# Patient Record
Sex: Female | Born: 1960 | Race: White | Hispanic: No | Smoking: Former smoker
Health system: Southern US, Community
[De-identification: ages and names within clinical notes are randomized; demographics above are authoritative.]

## PROBLEM LIST (undated history)

## (undated) DIAGNOSIS — F419 Anxiety disorder, unspecified: Secondary | ICD-10-CM

## (undated) DIAGNOSIS — T7840XA Allergy, unspecified, initial encounter: Secondary | ICD-10-CM

## (undated) DIAGNOSIS — F32A Depression, unspecified: Secondary | ICD-10-CM

## (undated) HISTORY — DX: Depression, unspecified: F32.A

## (undated) HISTORY — PX: MASTOPEXY: SHX5358

## (undated) HISTORY — DX: Allergy, unspecified, initial encounter: T78.40XA

## (undated) HISTORY — PX: COSMETIC SURGERY: SHX468

## (undated) HISTORY — PX: OTHER SURGICAL HISTORY: SHX169

## (undated) HISTORY — PX: BUNIONECTOMY: SHX129

## (undated) HISTORY — PX: BREAST ENHANCEMENT SURGERY: SHX7

## (undated) HISTORY — PX: WISDOM TOOTH EXTRACTION: SHX21

## (undated) HISTORY — DX: Anxiety disorder, unspecified: F41.9

## (undated) HISTORY — PX: POLYPECTOMY: SHX149

---

## 2013-03-11 HISTORY — PX: COLONOSCOPY: SHX174

## 2013-07-06 ENCOUNTER — Encounter: Payer: Self-pay | Admitting: Internal Medicine

## 2013-07-20 ENCOUNTER — Encounter: Payer: Self-pay | Admitting: Internal Medicine

## 2013-08-24 ENCOUNTER — Encounter: Payer: Self-pay | Admitting: Internal Medicine

## 2013-09-07 ENCOUNTER — Ambulatory Visit (AMBULATORY_SURGERY_CENTER): Payer: Managed Care, Other (non HMO)

## 2013-09-07 VITALS — Ht 64.0 in | Wt 136.2 lb

## 2013-09-07 DIAGNOSIS — Z800 Family history of malignant neoplasm of digestive organs: Secondary | ICD-10-CM

## 2013-09-07 NOTE — Progress Notes (Signed)
No allergies to eggs or soy No past problems with anesthesia No home oxygen No diet/weight loss meds  Has email  Emmi instructions given for colonoscopy 

## 2013-09-09 ENCOUNTER — Encounter: Payer: Self-pay | Admitting: Internal Medicine

## 2013-09-23 ENCOUNTER — Encounter: Payer: Self-pay | Admitting: Internal Medicine

## 2013-09-23 ENCOUNTER — Ambulatory Visit (AMBULATORY_SURGERY_CENTER): Payer: Managed Care, Other (non HMO) | Admitting: Internal Medicine

## 2013-09-23 VITALS — BP 114/58 | HR 58 | Temp 98.3°F | Resp 15 | Ht 64.0 in | Wt 136.0 lb

## 2013-09-23 DIAGNOSIS — D126 Benign neoplasm of colon, unspecified: Secondary | ICD-10-CM

## 2013-09-23 DIAGNOSIS — Z1211 Encounter for screening for malignant neoplasm of colon: Secondary | ICD-10-CM

## 2013-09-23 MED ORDER — SODIUM CHLORIDE 0.9 % IV SOLN: 500.0000 mL | INTRAVENOUS | Status: DC

## 2013-09-23 NOTE — Progress Notes (Signed)
Report to PACU, RN, vss, BBS= Clear.  

## 2013-09-23 NOTE — Progress Notes (Signed)
PT. Denies having blood cultures after returning from [place], she stated that she had vomiting and diarrhea after returning from trip. Risk management nurse made aware.

## 2013-09-23 NOTE — Patient Instructions (Signed)
YOU HAD AN ENDOSCOPIC PROCEDURE TODAY AT THE [facility]: Refer to the procedure report that was given to you for any specific questions about what was found during the examination.  If the procedure report does not answer your questions, please call your gastroenterologist to clarify.  If you requested that your care partner not be given the details of your procedure findings, then the procedure report has been included in a sealed envelope for you to review at your convenience later.  YOU SHOULD EXPECT: Some feelings of bloating in the abdomen. Passage of more gas than usual.  Walking can help get rid of the air that was put into your GI tract during the procedure and reduce the bloating. If you had a lower endoscopy (such as a colonoscopy or flexible sigmoidoscopy) you may notice spotting of blood in your stool or on the toilet paper. If you underwent a bowel prep for your procedure, then you may not have a normal bowel movement for a few days.  DIET: Your first meal following the procedure should be a light meal and then it is ok to progress to your normal diet.  A half-sandwich or bowl of soup is an example of a good first meal.  Heavy or fried foods are harder to digest and may make you feel nauseous or bloated.  Likewise meals heavy in dairy and vegetables can cause extra gas to form and this can also increase the bloating.  Drink plenty of fluids but you should avoid alcoholic beverages for 24 hours.  ACTIVITY: Your care partner should take you home directly after the procedure.  You should plan to take it easy, moving slowly for the rest of the day.  You can resume normal activity the day after the procedure however you should NOT DRIVE or use heavy machinery for 24 hours (because of the sedation medicines used during the test).    SYMPTOMS TO REPORT IMMEDIATELY: A gastroenterologist can be reached at any hour.  During normal business hours, 8:30 AM to 5:00 PM Monday through Friday,  call [phone].  After hours and on weekends, please call the GI answering service at [phone] who will take a message and have the physician on call contact you.   Following lower endoscopy (colonoscopy or flexible sigmoidoscopy):  Excessive amounts of blood in the stool  Significant tenderness or worsening of abdominal pains  Swelling of the abdomen that is new, acute  Fever of 100F or higher    FOLLOW UP: If any biopsies were taken you will be contacted by phone or by letter within the next 1-3 weeks.  Call your gastroenterologist if you have not heard about the biopsies in 3 weeks.  Our staff will call the home number listed on your records the next business day following your procedure to check on you and address any questions or concerns that you may have at that time regarding the information given to you following your procedure. This is a courtesy call and so if there is no answer at the home number and we have not heard from you through the emergency physician on call, we will assume that you have returned to your regular daily activities without incident.  SIGNATURES/CONFIDENTIALITY: You and/or your care partner have signed paperwork which will be entered into your electronic medical record.  These signatures attest to the fact that that the information above on your After Visit Summary has been reviewed and is understood.  Full responsibility of the confidentiality   of this discharge information lies with you and/or your care-partner.  Polyp information given.  Avoid all NSAIDS for next two weeks.

## 2013-09-23 NOTE — Op Note (Signed)
[place]  [address] [place], [id number]   COLONOSCOPY PROCEDURE REPORT  PATIENT: Karen Munoz, Karen Munoz  MR#: [id number] BIRTHDATE: Oct 16, 1960 , 52  yrs. old GENDER: Female ENDOSCOPIST: Jerene Bears, MD REFERRED BY: Jerline Pain, MD PROCEDURE DATE:  09/23/2013 PROCEDURE:   Colonoscopy with snare polypectomy First Screening Colonoscopy - Avg.  risk and is 50 yrs.  old or older Yes.  Prior Negative Screening - Now for repeat screening. N/A  History of Adenoma - Now for follow-up colonoscopy & has been > or = to 3 yrs.  N/A  Polyps Removed Today? Yes. ASA CLASS:   Class I INDICATIONS:average risk screening and first colonoscopy. MEDICATIONS: MAC sedation, administered by CRNA and propofol (Diprivan) 500mg  IV  DESCRIPTION OF PROCEDURE:   After the risks benefits and alternatives of the procedure were thoroughly explained, informed consent was obtained.  A digital rectal exam revealed no rectal mass.   The LB [id number]  endoscope was introduced through the anus and advanced to the cecum, which was identified by both the appendix and ileocecal valve. No adverse events experienced. The quality of the prep was good, using MoviPrep  The instrument was then slowly withdrawn as the colon was fully examined.  COLON FINDINGS: A sessile polyp measuring 8-9 mm in size with a mucous cap was found at the cecum.  A polypectomy was performed with a cold snare.  The resection was complete and the polyp tissue was completely retrieved.   A semi-pedunculated polyp measuring 6 mm in size was found in the rectosigmoid colon.  A polypectomy was performed with a cold snare.  The resection was complete and the polyp tissue was completely retrieved.   The colon mucosa was otherwise normal.  Retroflexed views revealed external hemorrhoids. The time to cecum=5 minutes 00 seconds.  Withdrawal time=15 minutes 18 seconds.  The scope was withdrawn and the procedure  completed. COMPLICATIONS: There were no complications.  ENDOSCOPIC IMPRESSION: 1.   Sessile polyp measuring 8-9 mm in size was found at the cecum; polypectomy was performed with a cold snare 2.   Semi-pedunculated polyp measuring 6 mm in size was found in the rectosigmoid colon; polypectomy was performed with a cold snare 3.   The colon mucosa was otherwise normal  RECOMMENDATIONS: 1.  Avoid all NSAIDS for the next 2 weeks. 2.  If the polyps removed today are proven to be adenomatous (pre-cancerous) polyps, you will need a repeat colonoscopy in 5 years.  Otherwise you should continue to follow colorectal cancer screening guidelines for "routine risk" patients with colonoscopy in 10 years.  You will receive a letter within 1-2 weeks with the results of your biopsy as well as final recommendations.  Please call my office if you have not received a letter after 3 weeks.   eSigned:  Jerene Bears, MD 09/23/2013 10:06 AM     cc: The Patient; Jerline Pain, MD [place], [place])

## 2013-09-23 NOTE — Progress Notes (Signed)
Called to room to assist during endoscopic procedure.  Patient ID and intended procedure confirmed with present staff. Received instructions for my participation in the procedure from the performing physician.  

## 2013-09-24 ENCOUNTER — Telehealth: Payer: Self-pay

## 2013-09-24 NOTE — Telephone Encounter (Signed)
  Follow up Call-  Call back number 09/23/2013  Post procedure Call Back phone  # [phone]  Permission to leave phone message Yes     Patient questions:  Do you have a fever, pain , or abdominal swelling? No. Pain Score  0 *  Have you tolerated food without any problems? Yes.    Have you been able to return to your normal activities? Yes.    Do you have any questions about your discharge instructions: Diet   No. Medications  No. Follow up visit  No.  Do you have questions or concerns about your Care? No.  Actions: * If pain score is 4 or above: No action needed, pain <4.   Pt said she was still bloated this am.  I advised her she could try gas-x tabs ot [place] and see if it helps.  To call us back if gas does not subside today.  maw

## 2013-09-28 ENCOUNTER — Encounter: Payer: Self-pay | Admitting: Internal Medicine

## 2015-06-12 LAB — HM MAMMOGRAPHY: HM Mammogram: NORMAL (ref 0–4)

## 2015-06-12 LAB — HM PAP SMEAR: HM Pap smear: NORMAL

## 2016-04-22 ENCOUNTER — Encounter: Payer: Self-pay | Admitting: Primary Care

## 2016-04-22 ENCOUNTER — Ambulatory Visit (INDEPENDENT_AMBULATORY_CARE_PROVIDER_SITE_OTHER): Payer: Managed Care, Other (non HMO) | Admitting: Primary Care

## 2016-04-22 VITALS — BP 112/70 | HR 81 | Temp 97.5°F | Ht 63.5 in | Wt 140.8 lb

## 2016-04-22 DIAGNOSIS — R21 Rash and other nonspecific skin eruption: Secondary | ICD-10-CM | POA: Insufficient documentation

## 2016-04-22 DIAGNOSIS — F411 Generalized anxiety disorder: Secondary | ICD-10-CM | POA: Diagnosis not present

## 2016-04-22 DIAGNOSIS — G43901 Migraine, unspecified, not intractable, with status migrainosus: Secondary | ICD-10-CM

## 2016-04-22 HISTORY — DX: Rash and other nonspecific skin eruption: R21

## 2016-04-22 MED ORDER — PREDNISONE 10 MG PO TABS: ORAL_TABLET | ORAL | 0 refills | Status: DC

## 2016-04-22 MED ORDER — TRIAMCINOLONE ACETONIDE 0.5 % EX OINT: 1.0000 "application " | TOPICAL_OINTMENT | Freq: Two times a day (BID) | CUTANEOUS | 0 refills | Status: DC

## 2016-04-22 MED ORDER — FLUOXETINE HCL 20 MG PO TABS: 20.0000 mg | ORAL_TABLET | Freq: Every day | ORAL | 1 refills | Status: DC

## 2016-04-22 MED ORDER — CYCLOBENZAPRINE HCL 5 MG PO TABS: ORAL_TABLET | ORAL | 1 refills | Status: DC

## 2016-04-22 NOTE — Progress Notes (Signed)
Subjective:    Patient ID: Karen Munoz, female    DOB: 01-09-1961, 56 y.o.   MRN: [id number]  HPI  Karen Munoz is a 56 year old female who presents today to establish care and discuss the problems mentioned below. Will obtain old records. Her last physical was in Fall 2016.   1) Rash: Present consistently since November 2017. Located to her neck, left axilla, right axilla, under her breasts, groin, bilateral antecubital fossa. She's been applying lotrimin, coconut oil, tea tree oil, and OTC hydrocortisone. Her rash is itchy in nature. No one else in her household is itching. She denies changes in meds, detergents, soaps, deodorants. She has noticed improvement to OTC lotrimin lotion, but no resolve.   2) Frequent Headaches/Migraines: Diagnosed years ago. Currently managed on ibuprofen, cyclobenzaprine, frovatriptan. She takes Flexeril on Friday and Saturday as that is typically when her migraines present. Overall her migraines are well controlled. She has noticed feeling drowsy the morning after taking Flexeril and would like to try a reduced dose.  3) Panic Attacks/Anxiety: Sporadic since early 2017. She's experienced symptoms of irritability, worry, stress, anxiety. She is currently caring for her brother, and is tending to her mother's estate since she passes away in 08/13/15. She is also caring for her husband who has dementia and requires frequent attention. She was managed on Valium which she took PRN, she's been out of her medication for the past several months. She was once managed on Fluoxetine with success. GAD 7 score of 12 today. She takes Melatonin at bedtime for sleep.  Review of Systems  Respiratory: Negative for shortness of breath.   Cardiovascular: Negative for chest pain.  Skin: Positive for rash.  Neurological: Positive for headaches.  Psychiatric/Behavioral: Positive for sleep disturbance. Negative for suicidal ideas. The patient is nervous/anxious.        No past medical  history on file.   Social History   Social History  . Marital status: Unknown    Spouse name: N/A  . Number of children: N/A  . Years of education: N/A   Occupational History  . Not on file.   Social History Main Topics  . Smoking status: Former Research scientist (life sciences)  . Smokeless tobacco: Never Used  . Alcohol use Yes     Comment: occasionally on  monthly basis  . Drug use: No  . Sexual activity: Not on file   Other Topics Concern  . Not on file   Social History Narrative  . No narrative on file    Past Surgical History:  Procedure Laterality Date  . BUNIONECTOMY     x2  . WISDOM TOOTH EXTRACTION      Family History  Problem Relation Age of Onset  . Colon cancer Maternal Grandfather   . Renal Disease Mother   . COPD Mother   . Hypertension Mother     Allergies  Allergen Reactions  . Penicillins Rash    Current Outpatient Prescriptions on File Prior to Visit  Medication Sig Dispense Refill  . Coenzyme Q10 (CO Q 10 PO) Take 30 mg by mouth daily. 15     . niacin 250 MG tablet Take 250 mg by mouth at bedtime.    . Omega-3 Fatty Acids (FISH OIL PO) Take 690 mg by mouth.     . Red Yeast Rice Extract (RED YEAST RICE PO) Take 1,200 mg by mouth daily. 1.2gms      No current facility-administered medications on file prior to visit.  BP 112/70   Pulse 81   Temp 97.5 F (36.4 C) (Oral)   Ht 5' 3.5" (1.613 m)   Wt 140 lb 12.8 oz (63.9 kg)   SpO2 99%   BMI 24.55 kg/m    Objective:   Physical Exam  Constitutional: She appears well-nourished.  Neck: Neck supple.  Cardiovascular: Normal rate and regular rhythm.   Pulmonary/Chest: Effort normal and breath sounds normal.  Skin: Skin is warm and dry.  Dry, mildly scaling patches to neck, bilateral antecubital fossa, axilla, and under breasts. Representative of eczema.  Psychiatric: She has a normal mood and affect.          Assessment & Plan:

## 2016-04-22 NOTE — Patient Instructions (Signed)
Start fluoxetine (Prozac) 20 mg tablets for anxiety. Take 1/2 tablet daily for 8 days then increase to 1 full tablet thereafter.  Start prednisone tablets. Take three tablets for 2 days, then two tablets for 2 days, then one tablet for 3 days. This should resolve the rash.  You may apply the Triamcinolone ointment twice daily as needed for itchy/uncomfortable rash.  I sent cyclobenzaprine 5 mg tablets to your pharmacy.   Schedule a follow up visit in 4-6 weeks for re-evaluation of anxiety.  It was a pleasure to meet you today! Please don't hesitate to call me with any questions. Welcome to [facility]!

## 2016-04-22 NOTE — Progress Notes (Signed)
Pre visit review using our clinic review tool, if applicable. No additional management support is needed unless otherwise documented below in the visit note. 

## 2016-04-22 NOTE — Assessment & Plan Note (Signed)
Representative of eczema. Treat with oral prednisone given widespread presentation. Rx for Triamcinolone ointment sent to pharmacy. She will update if no improvement.

## 2016-04-22 NOTE — Assessment & Plan Note (Signed)
Doing well on flexeril, uses abortive medication infrequently. Will refill Flexeril at 5 mg to reduce next day drowsiness. Will continue to monitor.

## 2016-04-22 NOTE — Assessment & Plan Note (Signed)
Going through a lot of personal stress. GAD 7 score of 12 today. Will restart Fluoxetine as she was successful on this in the past.  Patient is to take 1/2 tablet daily for 8 days, then advance to 1 full tablet thereafter. We discussed possible side effects of headache, GI upset, drowsiness, and SI/HI. If thoughts of SI/HI develop, we discussed to present to the emergency immediately. Patient verbalized understanding.   Follow up in 6 weeks for re-evaluation.

## 2016-05-22 ENCOUNTER — Other Ambulatory Visit: Payer: Self-pay | Admitting: Primary Care

## 2016-05-22 DIAGNOSIS — Z0000 Encounter for general adult medical examination without abnormal findings: Secondary | ICD-10-CM

## 2016-05-29 ENCOUNTER — Other Ambulatory Visit (INDEPENDENT_AMBULATORY_CARE_PROVIDER_SITE_OTHER): Payer: Managed Care, Other (non HMO)

## 2016-05-29 DIAGNOSIS — Z0000 Encounter for general adult medical examination without abnormal findings: Secondary | ICD-10-CM

## 2016-05-29 LAB — VITAMIN D 25 HYDROXY (VIT D DEFICIENCY, FRACTURES): VITD: 55.11 ng/mL (ref 30.00–100.00)

## 2016-05-29 LAB — COMPREHENSIVE METABOLIC PANEL
ALBUMIN: 4.2 g/dL (ref 3.5–5.2)
ALT: 15 U/L (ref 0–35)
AST: 19 U/L (ref 0–37)
Alkaline Phosphatase: 69 U/L (ref 39–117)
BILIRUBIN TOTAL: 0.4 mg/dL (ref 0.2–1.2)
BUN: 10 mg/dL (ref 6–23)
CHLORIDE: 104 meq/L (ref 96–112)
CO2: 30 mEq/L (ref 19–32)
CREATININE: 0.73 mg/dL (ref 0.40–1.20)
Calcium: 9.6 mg/dL (ref 8.4–10.5)
GFR: 88.43 mL/min (ref >60.00)
Glucose, Bld: 95 mg/dL (ref 70–99)
Potassium: 4.2 mEq/L (ref 3.5–5.1)
Sodium: 139 mEq/L (ref 135–145)
Total Protein: 6.7 g/dL (ref 6.0–8.3)

## 2016-05-29 LAB — LIPID PANEL
CHOL/HDL RATIO: 2
Cholesterol: 226 mg/dL — ABNORMAL HIGH (ref 0–200)
HDL: 93.9 mg/dL (ref >39.00)
LDL CALC: 123 mg/dL — AB (ref 0–99)
NONHDL: 132
TRIGLYCERIDES: 47 mg/dL (ref 0.0–149.0)
VLDL: 9.4 mg/dL (ref 0.0–40.0)

## 2016-06-04 ENCOUNTER — Ambulatory Visit (INDEPENDENT_AMBULATORY_CARE_PROVIDER_SITE_OTHER): Payer: Managed Care, Other (non HMO) | Admitting: Primary Care

## 2016-06-04 ENCOUNTER — Encounter: Payer: Self-pay | Admitting: Primary Care

## 2016-06-04 VITALS — BP 116/80 | HR 71 | Temp 97.8°F | Ht 63.5 in | Wt 144.8 lb

## 2016-06-04 DIAGNOSIS — F411 Generalized anxiety disorder: Secondary | ICD-10-CM

## 2016-06-04 DIAGNOSIS — Z0001 Encounter for general adult medical examination with abnormal findings: Secondary | ICD-10-CM | POA: Insufficient documentation

## 2016-06-04 DIAGNOSIS — E785 Hyperlipidemia, unspecified: Secondary | ICD-10-CM | POA: Insufficient documentation

## 2016-06-04 DIAGNOSIS — Z23 Encounter for immunization: Secondary | ICD-10-CM | POA: Diagnosis not present

## 2016-06-04 DIAGNOSIS — Z0000 Encounter for general adult medical examination without abnormal findings: Secondary | ICD-10-CM | POA: Diagnosis not present

## 2016-06-04 DIAGNOSIS — R21 Rash and other nonspecific skin eruption: Secondary | ICD-10-CM

## 2016-06-04 DIAGNOSIS — G43701 Chronic migraine without aura, not intractable, with status migrainosus: Secondary | ICD-10-CM

## 2016-06-04 MED ORDER — FROVATRIPTAN SUCCINATE 2.5 MG PO TABS: ORAL_TABLET | ORAL | 2 refills | Status: DC

## 2016-06-04 NOTE — Progress Notes (Signed)
Pre visit review using our clinic review tool, if applicable. No additional management support is needed unless otherwise documented below in the visit note. 

## 2016-06-04 NOTE — Patient Instructions (Signed)
Continue to work on a healthy diet.   Start exercising. You should be getting 150 minutes of moderate intensity exercise weekly.  Ensure you are consuming 64 ounces of water daily.  You were provided with a tetanus vaccination today, this will cover you for 10 years.   Follow up with GYN as scheduled in April.   Follow up in 1 year for your annual exam or sooner if needed.  It was a pleasure to see you today!

## 2016-06-04 NOTE — Assessment & Plan Note (Signed)
Td due, provided today. Mammogram and Pap UTD. Colonoscopy UTD.  Discussed the importance of a healthy diet and regular exercise in order for weight loss, and to reduce the risk of other medical diseases. Exam unremarkable.  Labs with mild hyperlipidemia.  Follow up in 1 year for annual exam.

## 2016-06-04 NOTE — Assessment & Plan Note (Signed)
Significant improvement since last visit. Mild dry patches. Will have her moisturize skin.

## 2016-06-04 NOTE — Assessment & Plan Note (Signed)
Stable on current regimen, refill provided of Frova.

## 2016-06-04 NOTE — Assessment & Plan Note (Signed)
Previously on medication. TC slightly above goal, however HDL is 93. LDL unremarkable. Will continue to monitor levels, add in exercise. Continue to improve diet.

## 2016-06-04 NOTE — Addendum Note (Signed)
Addended by: Jacqualin Combes on: 06/04/2016 12:53 PM   Modules accepted: Orders

## 2016-06-04 NOTE — Progress Notes (Signed)
Subjective:    Patient ID: Karen Munoz, female    DOB: 1960/06/26, 56 y.o.   MRN: [id number]  HPI  Karen Munoz is a 56 year old female who presents today for complete physical.  Immunizations: -Tetanus: Completed in 2007, due. -Influenza: Did not complete last season.    Diet: She endorses a healthy diet. Breakfast: Oatmeal Lunch: Salad with protein Dinner: Chicken, vegetables, little starches Snacks: Chips Desserts: Occasionally Beverages: Coffee, water, ginger-ale  Exercise: She does not currently exercise Eye exam: Completed in December 2017 Dental exam: Completes every six months Colonoscopy: Completed in 2015, due in 2020 Pap Smear: Completed in 2017, follows with GYN. Mammogram: Completed in April 2017, normal.   Review of Systems  Constitutional: Negative for unexpected weight change.  HENT: Negative for rhinorrhea.   Respiratory: Negative for cough and shortness of breath.   Cardiovascular: Negative for chest pain.  Gastrointestinal: Negative for constipation and diarrhea.  Genitourinary: Negative for difficulty urinating and menstrual problem.  Musculoskeletal: Negative for arthralgias and myalgias.  Skin: Negative for rash.  Allergic/Immunologic: Negative for environmental allergies.  Neurological: Negative for dizziness, numbness and headaches.  Psychiatric/Behavioral:       Doing well with anxiety. Feels well managed on Fluoxetine.        No past medical history on file.   Social History   Social History  . Marital status: Unknown    Spouse name: N/A  . Number of children: N/A  . Years of education: N/A   Occupational History  . Not on file.   Social History Main Topics  . Smoking status: Former Research scientist (life sciences)  . Smokeless tobacco: Never Used  . Alcohol use Yes     Comment: occasionally on  monthly basis  . Drug use: No  . Sexual activity: Not on file   Other Topics Concern  . Not on file   Social History Narrative  . No narrative on file     Past Surgical History:  Procedure Laterality Date  . BUNIONECTOMY     x2  . WISDOM TOOTH EXTRACTION      Family History  Problem Relation Age of Onset  . Colon cancer Maternal Grandfather   . Renal Disease Mother   . COPD Mother   . Hypertension Mother     Allergies  Allergen Reactions  . Penicillins Rash    Current Outpatient Prescriptions on File Prior to Visit  Medication Sig Dispense Refill  . aspirin EC 81 MG tablet Take 81 mg by mouth daily.    . Cholecalciferol (D3 ADULT PO) Take 125 mcg by mouth daily.    . Coenzyme Q10 (CO Q 10 PO) Take 30 mg by mouth daily. 15     . Cyanocobalamin (B-12 PO) Take 1,000 mcg by mouth daily.    . cyclobenzaprine (FLEXERIL) 5 MG tablet Take 1 tablet by mouth at bedtime as needed for migraines/headaches. 30 tablet 1  . FLUoxetine (PROZAC) 20 MG tablet Take 1 tablet (20 mg total) by mouth daily. 30 tablet 1  . ibuprofen (ADVIL,MOTRIN) 800 MG tablet Take 800 mg by mouth.    . niacin 250 MG tablet Take 250 mg by mouth at bedtime.    . Omega-3 Fatty Acids (FISH OIL PO) Take 690 mg by mouth.     . PSYLLIUM PO Take 1,500 mg by mouth daily.    . Red Yeast Rice Extract (RED YEAST RICE PO) Take 1,200 mg by mouth daily. 1.2gms     . triamcinolone ointment (KENALOG)  0.5 % Apply 1 application topically 2 (two) times daily. 30 g 0  . frovatriptan (FROVA) 2.5 MG tablet TAKE ONE TAB BY MOUTH FOR MIGRAINE AND REPEAT IN 2 HOURS IF NEEDED. NO MORE THAN THREE TABS PER DAY     No current facility-administered medications on file prior to visit.     BP 116/80   Pulse 71   Temp 97.8 F (36.6 C) (Oral)   Ht 5' 3.5" (1.613 m)   Wt 144 lb 12.8 oz (65.7 kg)   BMI 25.25 kg/m    Objective:   Physical Exam  Constitutional: She is oriented to person, place, and time. She appears well-nourished.  HENT:  Right Ear: Tympanic membrane and ear canal normal.  Left Ear: Tympanic membrane and ear canal normal.  Nose: Nose normal.  Mouth/Throat: Oropharynx  is clear and moist.  Eyes: Conjunctivae and EOM are normal. Pupils are equal, round, and reactive to light.  Neck: Neck supple. No thyromegaly present.  Cardiovascular: Normal rate and regular rhythm.   No murmur heard. Pulmonary/Chest: Effort normal and breath sounds normal. She has no rales.  Abdominal: Soft. Bowel sounds are normal. There is no tenderness.  Musculoskeletal: Normal range of motion.  Lymphadenopathy:    She has no cervical adenopathy.  Neurological: She is alert and oriented to person, place, and time. She has normal reflexes. No cranial nerve deficit.  Skin: Skin is warm and dry. No rash noted.  Psychiatric: She has a normal mood and affect.          Assessment & Plan:

## 2016-06-04 NOTE — Assessment & Plan Note (Signed)
Significantly improved on Fluoxetine, no recent panic attacks. Continue current dose.

## 2016-06-12 ENCOUNTER — Other Ambulatory Visit: Payer: Self-pay | Admitting: Primary Care

## 2016-06-12 DIAGNOSIS — F411 Generalized anxiety disorder: Secondary | ICD-10-CM

## 2016-06-14 ENCOUNTER — Other Ambulatory Visit: Payer: Self-pay | Admitting: Primary Care

## 2016-06-14 DIAGNOSIS — F411 Generalized anxiety disorder: Secondary | ICD-10-CM

## 2016-06-17 MED ORDER — FLUOXETINE HCL 20 MG PO CAPS: 20.0000 mg | ORAL_CAPSULE | Freq: Every day | ORAL | 1 refills | Status: DC

## 2016-06-17 NOTE — Addendum Note (Signed)
Addended by: Jacqualin Combes on: 06/17/2016 05:45 PM   Modules accepted: Orders

## 2016-06-22 ENCOUNTER — Other Ambulatory Visit: Payer: Self-pay | Admitting: Primary Care

## 2016-06-22 DIAGNOSIS — G43901 Migraine, unspecified, not intractable, with status migrainosus: Secondary | ICD-10-CM

## 2016-11-27 ENCOUNTER — Other Ambulatory Visit: Payer: Self-pay | Admitting: Primary Care

## 2016-11-27 DIAGNOSIS — G43901 Migraine, unspecified, not intractable, with status migrainosus: Secondary | ICD-10-CM

## 2016-11-27 DIAGNOSIS — G43701 Chronic migraine without aura, not intractable, with status migrainosus: Secondary | ICD-10-CM

## 2016-11-27 NOTE — Telephone Encounter (Signed)
Ok to refill? Electronically refill request for   frovatriptan (FROVA) 2.5 MG tablet - Last prescribed on 06/04/2016  cyclobenzaprine (FLEXERIL) 5 MG tablet - Last prescribed on 06/24/2016  Last seen on 06/04/2016

## 2016-11-27 NOTE — Telephone Encounter (Signed)
Refills sent to pharmacy. 

## 2016-12-21 ENCOUNTER — Other Ambulatory Visit: Payer: Self-pay | Admitting: Primary Care

## 2017-06-15 ENCOUNTER — Other Ambulatory Visit: Payer: Self-pay | Admitting: Primary Care

## 2017-06-28 ENCOUNTER — Other Ambulatory Visit: Payer: Self-pay | Admitting: Primary Care

## 2017-06-28 DIAGNOSIS — E782 Mixed hyperlipidemia: Secondary | ICD-10-CM

## 2017-07-03 ENCOUNTER — Other Ambulatory Visit (INDEPENDENT_AMBULATORY_CARE_PROVIDER_SITE_OTHER): Payer: 59

## 2017-07-03 DIAGNOSIS — E782 Mixed hyperlipidemia: Secondary | ICD-10-CM

## 2017-07-03 LAB — COMPREHENSIVE METABOLIC PANEL
ALT: 16 U/L (ref 0–35)
AST: 20 U/L (ref 0–37)
Albumin: 4.1 g/dL (ref 3.5–5.2)
Alkaline Phosphatase: 70 U/L (ref 39–117)
BUN: 13 mg/dL (ref 6–23)
CHLORIDE: 103 meq/L (ref 96–112)
CO2: 30 mEq/L (ref 19–32)
Calcium: 9.4 mg/dL (ref 8.4–10.5)
Creatinine, Ser: 0.71 mg/dL (ref 0.40–1.20)
GFR: 90.94 mL/min (ref >60.00)
Glucose, Bld: 84 mg/dL (ref 70–99)
POTASSIUM: 3.8 meq/L (ref 3.5–5.1)
SODIUM: 139 meq/L (ref 135–145)
Total Bilirubin: 0.3 mg/dL (ref 0.2–1.2)
Total Protein: 6.7 g/dL (ref 6.0–8.3)

## 2017-07-03 LAB — LIPID PANEL
Cholesterol: 210 mg/dL — ABNORMAL HIGH (ref 0–200)
HDL: 95.3 mg/dL (ref >39.00)
LDL CALC: 105 mg/dL — AB (ref 0–99)
NONHDL: 114.98
Total CHOL/HDL Ratio: 2
Triglycerides: 49 mg/dL (ref 0.0–149.0)
VLDL: 9.8 mg/dL (ref 0.0–40.0)

## 2017-07-07 ENCOUNTER — Encounter: Payer: Self-pay | Admitting: Primary Care

## 2017-07-07 ENCOUNTER — Ambulatory Visit (INDEPENDENT_AMBULATORY_CARE_PROVIDER_SITE_OTHER): Payer: 59 | Admitting: Primary Care

## 2017-07-07 VITALS — BP 122/82 | HR 71 | Temp 97.8°F | Ht 63.5 in | Wt 163.0 lb

## 2017-07-07 DIAGNOSIS — F411 Generalized anxiety disorder: Secondary | ICD-10-CM | POA: Diagnosis not present

## 2017-07-07 DIAGNOSIS — E785 Hyperlipidemia, unspecified: Secondary | ICD-10-CM

## 2017-07-07 DIAGNOSIS — R21 Rash and other nonspecific skin eruption: Secondary | ICD-10-CM | POA: Diagnosis not present

## 2017-07-07 DIAGNOSIS — Z0000 Encounter for general adult medical examination without abnormal findings: Secondary | ICD-10-CM | POA: Diagnosis not present

## 2017-07-07 DIAGNOSIS — G43901 Migraine, unspecified, not intractable, with status migrainosus: Secondary | ICD-10-CM | POA: Diagnosis not present

## 2017-07-07 NOTE — Assessment & Plan Note (Signed)
Doing well on Cyclobenzaprine 4-5 times weekly on average. Helps to prevent migraine. No recent use of Frova.

## 2017-07-07 NOTE — Assessment & Plan Note (Signed)
Doing well Prozac, continue same. Denies SI/HI.

## 2017-07-07 NOTE — Progress Notes (Signed)
Subjective:    Patient ID: Karen Munoz, female    DOB: [birth year], 57 y.o.   MRN: [id number]  HPI  Karen Munoz is a 57 year old female who presents today for complete physical.  Immunizations: -Tetanus: Completed in 2018 -Influenza: Completed last season  Diet: She endorses a fair diet. Breakfast: Oatmeal, skips, granola bar, eggs Lunch: Restaurants, salads, chicken Dinner:  Chicken, fish, salad, cereal Snacks: None Desserts: Once weekly Beverages: Coffee, water, occasional soda  Exercise: She is not currently exercising Eye exam: Completed one year ago, due soon Dental exam: Completes semi-annually  Colonoscopy: Completed in 2015, due in 2020 Pap Smear: Completed in 2017, follows with GYN. Mammogram: Completed in 2017, due this year follows with GYN.     Review of Systems  Constitutional: Negative for unexpected weight change.  HENT: Negative for rhinorrhea.   Respiratory: Negative for cough and shortness of breath.   Cardiovascular: Negative for chest pain.  Gastrointestinal: Negative for constipation and diarrhea.  Genitourinary: Negative for difficulty urinating and menstrual problem.  Musculoskeletal: Negative for arthralgias and myalgias.  Skin: Negative for rash.  Allergic/Immunologic: Negative for environmental allergies.  Neurological: Negative for dizziness, numbness and headaches.  Psychiatric/Behavioral:       Feels well managed on Prozac, continue same.       No past medical history on file.   Social History   Socioeconomic History  . Marital status: Unknown    Spouse name: Not on file  . Number of children: Not on file  . Years of education: Not on file  . Highest education level: Not on file  Occupational History  . Not on file  Social Needs  . Financial resource strain: Not on file  . Food insecurity:    Worry: Not on file    Inability: Not on file  . Transportation needs:    Medical: Not on file    Non-medical: Not on file  Tobacco  Use  . Smoking status: Former Research scientist (life sciences)  . Smokeless tobacco: Never Used  Substance and Sexual Activity  . Alcohol use: Yes    Comment: occasionally on  monthly basis  . Drug use: No  . Sexual activity: Not on file  Lifestyle  . Physical activity:    Days per week: Not on file    Minutes per session: Not on file  . Stress: Not on file  Relationships  . Social connections:    Talks on phone: Not on file    Gets together: Not on file    Attends religious service: Not on file    Active member of club or organization: Not on file    Attends meetings of clubs or organizations: Not on file    Relationship status: Not on file  . Intimate partner violence:    Fear of current or ex partner: Not on file    Emotionally abused: Not on file    Physically abused: Not on file    Forced sexual activity: Not on file  Other Topics Concern  . Not on file  Social History Narrative  . Not on file    Past Surgical History:  Procedure Laterality Date  . BUNIONECTOMY     x2  . WISDOM TOOTH EXTRACTION      Family History  Problem Relation Age of Onset  . Colon cancer Maternal Grandfather   . Renal Disease Mother   . COPD Mother   . Hypertension Mother     Allergies  Allergen Reactions  .  Penicillins Rash    Current Outpatient Medications on File Prior to Visit  Medication Sig Dispense Refill  . aspirin EC 81 MG tablet Take 81 mg by mouth daily.    . Cholecalciferol (D3 ADULT PO) Take 125 mcg by mouth daily.    . Coenzyme Q10 (CO Q 10 PO) Take 30 mg by mouth daily. 15     . Cyanocobalamin (B-12 PO) Take 1,000 mcg by mouth daily.    . cyclobenzaprine (FLEXERIL) 5 MG tablet TAKE 1 TABLET BY MOUTH AT BEDTIME AS NEEDED FOR MIGRAINES/HEADACHES. 90 tablet 2  . FLUoxetine (PROZAC) 20 MG capsule TAKE 1 CAPSULE BY MOUTH EVERY DAY 90 capsule 1  . frovatriptan (FROVA) 2.5 MG tablet TAKE 1 TABLET AT MIGRAINE ONSET. MAY REPEAT IN 2 HOURS IF NO IMPROVEMENT. DO NOT EXCEED 2 TABS/24HR 10 tablet 1  .  ibuprofen (ADVIL,MOTRIN) 800 MG tablet Take 800 mg by mouth.    . niacin 250 MG tablet Take 250 mg by mouth at bedtime.    . Omega-3 Fatty Acids (FISH OIL PO) Take 690 mg by mouth.     . PSYLLIUM PO Take 1,500 mg by mouth daily.    . Red Yeast Rice Extract (RED YEAST RICE PO) Take 1,200 mg by mouth daily. 1.2gms     . triamcinolone ointment (KENALOG) 0.5 % Apply 1 application topically 2 (two) times daily. 30 g 0   No current facility-administered medications on file prior to visit.     BP 122/82   Pulse 71   Temp 97.8 F (36.6 C) (Oral)   Ht 5' 3.5" (1.613 m)   Wt 163 lb (73.9 kg)   SpO2 97%   BMI 28.42 kg/m    Objective:   Physical Exam  Constitutional: She is oriented to person, place, and time. She appears well-nourished.  HENT:  Right Ear: Tympanic membrane and ear canal normal.  Left Ear: Tympanic membrane and ear canal normal.  Nose: Nose normal.  Mouth/Throat: Oropharynx is clear and moist.  Eyes: Pupils are equal, round, and reactive to light. Conjunctivae and EOM are normal.  Neck: Neck supple. No thyromegaly present.  Cardiovascular: Normal rate and regular rhythm.  No murmur heard. Pulmonary/Chest: Effort normal and breath sounds normal. She has no rales.  Abdominal: Soft. Bowel sounds are normal. There is no tenderness.  Musculoskeletal: Normal range of motion.  Lymphadenopathy:    She has no cervical adenopathy.  Neurological: She is alert and oriented to person, place, and time. She has normal reflexes. No cranial nerve deficit.  Skin: Skin is warm and dry. No rash noted.  Psychiatric: She has a normal mood and affect.          Assessment & Plan:

## 2017-07-07 NOTE — Patient Instructions (Addendum)
Start exercising. You should be getting 150 minutes of moderate intensity exercise weekly.  Continue to work on a healthy diet. Increase vegetables, fruit, whole grains, lean protein.  Ensure you are consuming 64 ounces of water daily.  Follow up with your gynecologist as discussed.  Follow up in 1 year for your annual exam or sooner if needed.  It was a pleasure to see you today!   Preventive Care 40-64 Years, Female Preventive care refers to lifestyle choices and visits with your health care provider that can promote health and wellness. What does preventive care include?  A yearly physical exam. This is also called an annual well check.  Dental exams once or twice a year.  Routine eye exams. Ask your health care provider how often you should have your eyes checked.  Personal lifestyle choices, including: ? Daily care of your teeth and gums. ? Regular physical activity. ? Eating a healthy diet. ? Avoiding tobacco and drug use. ? Limiting alcohol use. ? Practicing safe sex. ? Taking low-dose aspirin daily starting at age 66. ? Taking vitamin and mineral supplements as recommended by your health care provider. What happens during an annual well check? The services and screenings done by your health care provider during your annual well check will depend on your age, overall health, lifestyle risk factors, and family history of disease. Counseling Your health care provider may ask you questions about your:  Alcohol use.  Tobacco use.  Drug use.  Emotional well-being.  Home and relationship well-being.  Sexual activity.  Eating habits.  Work and work Statistician.  Method of birth control.  Menstrual cycle.  Pregnancy history.  Screening You may have the following tests or measurements:  Height, weight, and BMI.  Blood pressure.  Lipid and cholesterol levels. These may be checked every 5 years, or more frequently if you are over 49 years old.  Skin  check.  Lung cancer screening. You may have this screening every year starting at age 7 if you have a 30-pack-year history of smoking and currently smoke or have quit within the past 15 years.  Fecal occult blood test (FOBT) of the stool. You may have this test every year starting at age 39.  Flexible sigmoidoscopy or colonoscopy. You may have a sigmoidoscopy every 5 years or a colonoscopy every 10 years starting at age 60.  Hepatitis C blood test.  Hepatitis B blood test.  Sexually transmitted disease (STD) testing.  Diabetes screening. This is done by checking your blood sugar (glucose) after you have not eaten for a while (fasting). You may have this done every 1-3 years.  Mammogram. This may be done every 1-2 years. Talk to your health care provider about when you should start having regular mammograms. This may depend on whether you have a family history of breast cancer.  BRCA-related cancer screening. This may be done if you have a family history of breast, ovarian, tubal, or peritoneal cancers.  Pelvic exam and Pap test. This may be done every 3 years starting at age 77. Starting at age 59, this may be done every 5 years if you have a Pap test in combination with an HPV test.  Bone density scan. This is done to screen for osteoporosis. You may have this scan if you are at high risk for osteoporosis.  Discuss your test results, treatment options, and if necessary, the need for more tests with your health care provider. Vaccines Your health care provider may recommend certain vaccines, such  as:  Influenza vaccine. This is recommended every year.  Tetanus, diphtheria, and acellular pertussis (Tdap, Td) vaccine. You may need a Td booster every 10 years.  Varicella vaccine. You may need this if you have not been vaccinated.  Zoster vaccine. You may need this after age 58.  Measles, mumps, and rubella (MMR) vaccine. You may need at least one dose of MMR if you were born in 1957  or later. You may also need a second dose.  Pneumococcal 13-valent conjugate (PCV13) vaccine. You may need this if you have certain conditions and were not previously vaccinated.  Pneumococcal polysaccharide (PPSV23) vaccine. You may need one or two doses if you smoke cigarettes or if you have certain conditions.  Meningococcal vaccine. You may need this if you have certain conditions.  Hepatitis A vaccine. You may need this if you have certain conditions or if you travel or work in places where you may be exposed to hepatitis A.  Hepatitis B vaccine. You may need this if you have certain conditions or if you travel or work in places where you may be exposed to hepatitis B.  Haemophilus influenzae type b (Hib) vaccine. You may need this if you have certain conditions.  Talk to your health care provider about which screenings and vaccines you need and how often you need them. This information is not intended to replace advice given to you by your health care provider. Make sure you discuss any questions you have with your health care provider. Document Released: 03/24/2015 Document Revised: 11/15/2015 Document Reviewed: 12/27/2014 Elsevier Interactive Patient Education  [employer].

## 2017-07-07 NOTE — Assessment & Plan Note (Signed)
Continues to be intermittent, waxes and wanes. Will be seeing new dermatologist soon.

## 2017-07-07 NOTE — Assessment & Plan Note (Signed)
Immunizations UTD. Pap smear UTD. Mammogram due, follows with GYN. Discussed the importance of a healthy diet and regular exercise in order for weight loss, and to reduce the risk of any potential medical problems. Exam unremarkable. Labs unremarkable. Follow up in 1 year.

## 2017-07-07 NOTE — Assessment & Plan Note (Signed)
HDL of 95, LDL of 105. Discussed to start regular exercise, work on diet. Repeat in 1 year.

## 2017-09-21 ENCOUNTER — Other Ambulatory Visit: Payer: Self-pay | Admitting: Primary Care

## 2017-09-21 DIAGNOSIS — G43901 Migraine, unspecified, not intractable, with status migrainosus: Secondary | ICD-10-CM

## 2017-12-14 ENCOUNTER — Other Ambulatory Visit: Payer: Self-pay | Admitting: Primary Care

## 2017-12-23 ENCOUNTER — Other Ambulatory Visit: Payer: Self-pay | Admitting: Primary Care

## 2017-12-23 DIAGNOSIS — G43701 Chronic migraine without aura, not intractable, with status migrainosus: Secondary | ICD-10-CM

## 2017-12-23 NOTE — Telephone Encounter (Signed)
Last prescribed on 11/27/2016 Last office visit on 07/07/2017

## 2017-12-24 MED ORDER — FROVATRIPTAN SUCCINATE 2.5 MG PO TABS: ORAL_TABLET | ORAL | 0 refills | Status: DC

## 2017-12-24 NOTE — Telephone Encounter (Signed)
Noted, refill sent to pharmacy. 

## 2018-03-15 ENCOUNTER — Other Ambulatory Visit: Payer: Self-pay | Admitting: Primary Care

## 2018-03-15 DIAGNOSIS — G43901 Migraine, unspecified, not intractable, with status migrainosus: Secondary | ICD-10-CM

## 2018-04-25 ENCOUNTER — Other Ambulatory Visit: Payer: Self-pay | Admitting: Primary Care

## 2018-04-25 DIAGNOSIS — G43701 Chronic migraine without aura, not intractable, with status migrainosus: Secondary | ICD-10-CM

## 2018-04-27 NOTE — Telephone Encounter (Signed)
Please notify patient that I sent a refill for her migraine medicine to the pharmacy. She will be due for a follow-up visit/physical in April/early May 2020.  Please schedule.

## 2018-04-27 NOTE — Telephone Encounter (Signed)
Electronic refill request. Frovatriptan Last office visit:   07/07/17 Last Filled:    10 tablet 0 12/24/2017  Please advise.

## 2018-05-30 ENCOUNTER — Other Ambulatory Visit: Payer: Self-pay | Admitting: Primary Care

## 2018-07-09 ENCOUNTER — Telehealth: Payer: Self-pay | Admitting: Primary Care

## 2018-07-09 NOTE — Telephone Encounter (Signed)
Called to r/s 5/8 and 5/12 appts. Lvm asking pt to call office.

## 2018-07-17 ENCOUNTER — Other Ambulatory Visit: Payer: 59

## 2018-07-21 ENCOUNTER — Encounter: Payer: 59 | Admitting: Primary Care

## 2018-08-18 ENCOUNTER — Other Ambulatory Visit: Payer: Self-pay | Admitting: Primary Care

## 2018-08-18 DIAGNOSIS — G43701 Chronic migraine without aura, not intractable, with status migrainosus: Secondary | ICD-10-CM

## 2018-08-19 NOTE — Telephone Encounter (Signed)
Noted. CPE was cancelled due to Covid-19. We will see her in August as scheduled.

## 2018-08-19 NOTE — Telephone Encounter (Signed)
Last prescribed on 04/27/2018. Last appointment on 07/07/2017. Next future appointment on 10/13/2018

## 2018-08-25 ENCOUNTER — Encounter: Payer: Self-pay | Admitting: Internal Medicine

## 2018-09-07 ENCOUNTER — Other Ambulatory Visit: Payer: Self-pay | Admitting: Primary Care

## 2018-09-07 DIAGNOSIS — G43901 Migraine, unspecified, not intractable, with status migrainosus: Secondary | ICD-10-CM

## 2018-10-01 ENCOUNTER — Other Ambulatory Visit: Payer: Self-pay | Admitting: Primary Care

## 2018-10-01 DIAGNOSIS — E785 Hyperlipidemia, unspecified: Secondary | ICD-10-CM

## 2018-10-09 ENCOUNTER — Other Ambulatory Visit (INDEPENDENT_AMBULATORY_CARE_PROVIDER_SITE_OTHER): Payer: 59

## 2018-10-09 ENCOUNTER — Other Ambulatory Visit: Payer: Self-pay

## 2018-10-09 DIAGNOSIS — E785 Hyperlipidemia, unspecified: Secondary | ICD-10-CM

## 2018-10-09 LAB — COMPREHENSIVE METABOLIC PANEL
ALT: 11 U/L (ref 0–35)
AST: 16 U/L (ref 0–37)
Albumin: 4.4 g/dL (ref 3.5–5.2)
Alkaline Phosphatase: 70 U/L (ref 39–117)
BUN: 17 mg/dL (ref 6–23)
CO2: 30 mEq/L (ref 19–32)
Calcium: 9.6 mg/dL (ref 8.4–10.5)
Chloride: 105 mEq/L (ref 96–112)
Creatinine, Ser: 0.79 mg/dL (ref 0.40–1.20)
GFR: 75.29 mL/min (ref >60.00)
Glucose, Bld: 82 mg/dL (ref 70–99)
Potassium: 4.1 mEq/L (ref 3.5–5.1)
Sodium: 141 mEq/L (ref 135–145)
Total Bilirubin: 0.4 mg/dL (ref 0.2–1.2)
Total Protein: 7 g/dL (ref 6.0–8.3)

## 2018-10-09 LAB — LIPID PANEL
Cholesterol: 208 mg/dL — ABNORMAL HIGH (ref 0–200)
HDL: 90.2 mg/dL (ref >39.00)
LDL Cholesterol: 106 mg/dL — ABNORMAL HIGH (ref 0–99)
NonHDL: 117.64
Total CHOL/HDL Ratio: 2
Triglycerides: 56 mg/dL (ref 0.0–149.0)
VLDL: 11.2 mg/dL (ref 0.0–40.0)

## 2018-10-13 ENCOUNTER — Encounter: Payer: Self-pay | Admitting: Primary Care

## 2018-10-13 ENCOUNTER — Ambulatory Visit (INDEPENDENT_AMBULATORY_CARE_PROVIDER_SITE_OTHER): Payer: 59 | Admitting: Primary Care

## 2018-10-13 DIAGNOSIS — F411 Generalized anxiety disorder: Secondary | ICD-10-CM

## 2018-10-13 DIAGNOSIS — G43901 Migraine, unspecified, not intractable, with status migrainosus: Secondary | ICD-10-CM

## 2018-10-13 DIAGNOSIS — E785 Hyperlipidemia, unspecified: Secondary | ICD-10-CM

## 2018-10-13 NOTE — Progress Notes (Signed)
Subjective:    Patient ID: Karen Munoz, female    DOB: [birth year], 58 y.o.   MRN: [id number]  HPI  Virtual Visit via Video Note  I connected with Kathleen Tamm on 10/13/18 at  9:00 AM EDT by a video enabled telemedicine application and verified that I am speaking with the correct person using two identifiers.  Location: Patient: Home Provider: Office   I discussed the limitations of evaluation and management by telemedicine and the availability of in person appointments. The patient expressed understanding and agreed to proceed.  History of Present Illness:  [name] is a 58 year old female who presents today for follow up of chronic conditions and general health maintenance.   Immunizations: -Tetanus: Completed in 2018 -Influenza: Due this season   Diet: She endorses a fair diet. Eating vegetables, fruit, lean protein, starch. Ice cream on occasion. She is drinking coffee, water, soda on the weekends. Exercise: She is not exercising, some weights.   Eye exam: Completed in 2019 Dental exam: Due this November 2020 Colonoscopy: Completed in 2015, due. She will schedule for this Fall. Pap Smear: Following with GYN Mammogram: Following with GYN Hep C Screen: Due    Observations/Objective:  Alert and oriented. Appears well, not sickly. No distress. Speaking in complete sentences.   Assessment and Plan:  See problem based charting.  Follow Up Instructions:  Start exercising. You should be getting 150 minutes of moderate intensity exercise weekly.  Continue to work on a healthy diet.  Ensure you are drinking 64 ounces of water daily.  Complete your colonoscopy and mammogram later this year as discussed.  It was a pleasure to see you today!    I discussed the assessment and treatment plan with the patient. The patient was provided an opportunity to ask questions and all were answered. The patient agreed with the plan and demonstrated an understanding of the  instructions.   The patient was advised to call back or seek an in-person evaluation if the symptoms worsen or if the condition fails to improve as anticipated.     Pleas Koch, NP    Review of Systems  Constitutional: Negative for unexpected weight change.  HENT: Negative for rhinorrhea.   Respiratory: Negative for cough and shortness of breath.   Cardiovascular: Negative for chest pain.  Gastrointestinal: Negative for constipation and diarrhea.  Genitourinary: Negative for difficulty urinating.  Musculoskeletal: Negative for arthralgias and myalgias.  Skin: Negative for rash.  Allergic/Immunologic: Negative for environmental allergies.  Neurological: Positive for headaches. Negative for dizziness and numbness.       Intermittent headaches, migraines.       No past medical history on file.   Social History   Socioeconomic History  . Marital status: Unknown    Spouse name: Not on file  . Number of children: Not on file  . Years of education: Not on file  . Highest education level: Not on file  Occupational History  . Not on file  Social Needs  . Financial resource strain: Not on file  . Food insecurity    Worry: Not on file    Inability: Not on file  . Transportation needs    Medical: Not on file    Non-medical: Not on file  Tobacco Use  . Smoking status: Former Research scientist (life sciences)  . Smokeless tobacco: Never Used  Substance and Sexual Activity  . Alcohol use: Yes    Comment: occasionally on  monthly basis  . Drug use: No  .  Sexual activity: Not on file  Lifestyle  . Physical activity    Days per week: Not on file    Minutes per session: Not on file  . Stress: Not on file  Relationships  . Social Herbalist on phone: Not on file    Gets together: Not on file    Attends religious service: Not on file    Active member of club or organization: Not on file    Attends meetings of clubs or organizations: Not on file    Relationship status: Not on file   . Intimate partner violence    Fear of current or ex partner: Not on file    Emotionally abused: Not on file    Physically abused: Not on file    Forced sexual activity: Not on file  Other Topics Concern  . Not on file  Social History Narrative  . Not on file    Past Surgical History:  Procedure Laterality Date  . BUNIONECTOMY     x2  . WISDOM TOOTH EXTRACTION      Family History  Problem Relation Age of Onset  . Colon cancer Maternal Grandfather   . Renal Disease Mother   . COPD Mother   . Hypertension Mother     Allergies  Allergen Reactions  . Penicillins Rash    Current Outpatient Medications on File Prior to Visit  Medication Sig Dispense Refill  . aspirin EC 81 MG tablet Take 81 mg by mouth daily.    . Cholecalciferol (D3 ADULT PO) Take 125 mcg by mouth daily.    . Coenzyme Q10 (CO Q 10 PO) Take 30 mg by mouth daily. 15     . Cyanocobalamin (B-12 PO) Take 1,000 mcg by mouth daily.    . cyclobenzaprine (FLEXERIL) 5 MG tablet TAKE 1 TABLET BY MOUTH AT BEDTIME AS NEEDED FOR MIGRAINES/HEADACHES. 90 tablet 1  . FLUoxetine (PROZAC) 20 MG capsule TAKE 1 CAPSULE BY MOUTH EVERY DAY 90 capsule 1  . frovatriptan (FROVA) 2.5 MG tablet TAKE 1 TABLET AT MIGRAINE ONSET. MAY REPEAT IN 2 HOURS IF NO IMPROVEMENT. DO NOT EXCEED 2 TABS/24HR 10 tablet 0  . ibuprofen (ADVIL,MOTRIN) 800 MG tablet Take 800 mg by mouth.    . niacin 250 MG tablet Take 250 mg by mouth at bedtime.    . Omega-3 Fatty Acids (FISH OIL PO) Take 690 mg by mouth.     . PSYLLIUM PO Take 1,500 mg by mouth daily.    . Red Yeast Rice Extract (RED YEAST RICE PO) Take 1,200 mg by mouth daily. 1.2gms     . triamcinolone ointment (KENALOG) 0.5 % Apply 1 application topically 2 (two) times daily. 30 g 0   No current facility-administered medications on file prior to visit.     Wt 160 lb (72.6 kg)   BMI 27.90 kg/m    Objective:   Physical Exam  Constitutional: She is oriented to person, place, and time. She  appears well-nourished.  HENT:  Head: Normocephalic.  Respiratory: Effort normal.  Musculoskeletal: Normal range of motion.  Neurological: She is alert and oriented to person, place, and time.  Psychiatric: She has a normal mood and affect.           Assessment & Plan:

## 2018-10-13 NOTE — Assessment & Plan Note (Signed)
Lipids within normal range. Encouraged healthy diet, regular exercise.

## 2018-10-13 NOTE — Patient Instructions (Signed)
Start exercising. You should be getting 150 minutes of moderate intensity exercise weekly.  Continue to work on a healthy diet.  Ensure you are drinking 64 ounces of water daily.  Complete your colonoscopy and mammogram later this year as discussed.  It was a pleasure to see you today!

## 2018-10-13 NOTE — Assessment & Plan Note (Signed)
Occurring every 6-8 weeks on average, taking Frova and Flexeril PRN, using sparingly. Continue same.

## 2018-10-13 NOTE — Assessment & Plan Note (Signed)
Overall stable, some breakthrough symptoms.  Offered therapy for which she kindly declines at this time, she will update if symptoms become difficult to manage.   Continue fluoxetine 20 mg.

## 2018-11-10 ENCOUNTER — Other Ambulatory Visit: Payer: Self-pay | Admitting: Primary Care

## 2019-05-07 ENCOUNTER — Other Ambulatory Visit: Payer: Self-pay | Admitting: Primary Care

## 2019-05-07 DIAGNOSIS — G43901 Migraine, unspecified, not intractable, with status migrainosus: Secondary | ICD-10-CM

## 2019-05-07 NOTE — Telephone Encounter (Signed)
Electronic refill request Last office visit 10/13/2018 No upcoming appointment scheduled Cyclobenzaprine last refill 09/07/18 #90/1 Fluoxitine last refill 11/10/18 #90/1

## 2019-09-03 ENCOUNTER — Ambulatory Visit: Payer: 59 | Admitting: Primary Care

## 2019-09-03 ENCOUNTER — Other Ambulatory Visit: Payer: Self-pay

## 2019-09-03 ENCOUNTER — Encounter: Payer: Self-pay | Admitting: Primary Care

## 2019-09-03 VITALS — BP 114/70 | HR 80 | Temp 96.7°F | Wt 160.5 lb

## 2019-09-03 DIAGNOSIS — M25569 Pain in unspecified knee: Secondary | ICD-10-CM | POA: Insufficient documentation

## 2019-09-03 DIAGNOSIS — M25561 Pain in right knee: Secondary | ICD-10-CM | POA: Diagnosis not present

## 2019-09-03 DIAGNOSIS — G8929 Other chronic pain: Secondary | ICD-10-CM | POA: Insufficient documentation

## 2019-09-03 MED ORDER — DICLOFENAC SODIUM 1 % EX GEL: 2.0000 g | Freq: Three times a day (TID) | CUTANEOUS | 0 refills | Status: DC | PRN

## 2019-09-03 NOTE — Assessment & Plan Note (Signed)
Acute for the last three weeks, overall improved compared to last week. Exam today benign.   Suspect arthritis vs bursitis, there is no swelling. Rx for Voltaren gel provided.  Continue Tylenol. Discussed to purchase knee sleeve.  She will update.

## 2019-09-03 NOTE — Patient Instructions (Signed)
You may apply the diclofenac gel three times daily as needed for pain/inflammation.  Continue Tylenol or Advil if needed.  Consider purchasing a knee sleeve for support.  Update me in a week.  It was a pleasure to see you today!

## 2019-09-03 NOTE — Progress Notes (Signed)
Subjective:    Patient ID: Karen Munoz, female    DOB: 1960/07/30, 59 y.o.   MRN: [id number]  HPI  This visit occurred during the SARS-CoV-2 public health emergency.  Safety protocols were in place, including screening questions prior to the visit, additional usage of staff PPE, and extensive cleaning of exam room while observing appropriate contact time as indicated for disinfecting solutions.   Ms. Karen Munoz is a 59 year old female with a history of migraines, anxiety, hyperlipidemia who presents today with a chief complaint of knee pain.  Her pain is located to right medial knee that began about three weeks ago. She describes her pain as achy and stiff, some feeling of instability when waking and standing in the morning. Her pain is worse when walking for prolonged periods of time, also when she first walks in the morning when waking.   She denies swelling, erythema, injury/trauma. She's been taking 1000 mg of Tylenol and 200 mg of Ibuprofen with some improvement. Today she's better than she was one week ago.   Review of Systems  Musculoskeletal: Positive for arthralgias. Negative for joint swelling.  Skin: Negative for color change.  Neurological: Negative for weakness and numbness.       No past medical history on file.   Social History   Socioeconomic History   Marital status: Unknown    Spouse name: Not on file   Number of children: Not on file   Years of education: Not on file   Highest education level: Not on file  Occupational History   Not on file  Tobacco Use   Smoking status: Former Smoker   Smokeless tobacco: Never Used  Substance and Sexual Activity   Alcohol use: Yes    Comment: occasionally on  monthly basis   Drug use: No   Sexual activity: Not on file  Other Topics Concern   Not on file  Social History Narrative   Not on file   Social Determinants of Health   Financial Resource Strain:    Difficulty of Paying Living Expenses:   Food  Insecurity:    Worried About Charity fundraiser in the Last Year:    Arboriculturist in the Last Year:   Transportation Needs:    Film/video editor (Medical):    Lack of Transportation (Non-Medical):   Physical Activity:    Days of Exercise per Week:    Minutes of Exercise per Session:   Stress:    Feeling of Stress :   Social Connections:    Frequency of Communication with Friends and Family:    Frequency of Social Gatherings with Friends and Family:    Attends Religious Services:    Active Member of Clubs or Organizations:    Attends Music therapist:    Marital Status:   Intimate Partner Violence:    Fear of Current or Ex-Partner:    Emotionally Abused:    Physically Abused:    Sexually Abused:     Past Surgical History:  Procedure Laterality Date   BUNIONECTOMY     x2   WISDOM TOOTH EXTRACTION      Family History  Problem Relation Age of Onset   Colon cancer Maternal Grandfather    Renal Disease Mother    COPD Mother    Hypertension Mother     Allergies  Allergen Reactions   Penicillins Rash    Current Outpatient Medications on File Prior to Visit  Medication Sig Dispense Refill  aspirin EC 81 MG tablet Take 81 mg by mouth daily.     Cholecalciferol (D3 ADULT PO) Take 125 mcg by mouth daily.     Coenzyme Q10 (CO Q 10 PO) Take 30 mg by mouth daily. 15      Cyanocobalamin (B-12 PO) Take 1,000 mcg by mouth daily.     cyclobenzaprine (FLEXERIL) 5 MG tablet TAKE 1 TABLET BY MOUTH AT BEDTIME AS NEEDED FOR MIGRAINES/HEADACHES. 90 tablet 1   FLUoxetine (PROZAC) 20 MG capsule TAKE 1 CAPSULE BY MOUTH EVERY DAY 90 capsule 1   frovatriptan (FROVA) 2.5 MG tablet TAKE 1 TABLET AT MIGRAINE ONSET. MAY REPEAT IN 2 HOURS IF NO IMPROVEMENT. DO NOT EXCEED 2 TABS/24HR 10 tablet 0   ibuprofen (ADVIL,MOTRIN) 800 MG tablet Take 800 mg by mouth.     niacin 250 MG tablet Take 250 mg by mouth at bedtime.     Omega-3 Fatty Acids  (FISH OIL PO) Take 690 mg by mouth.      PSYLLIUM PO Take 1,500 mg by mouth daily.     Red Yeast Rice Extract (RED YEAST RICE PO) Take 1,200 mg by mouth daily. 1.2gms      triamcinolone ointment (KENALOG) 0.5 % Apply 1 application topically 2 (two) times daily. 30 g 0   No current facility-administered medications on file prior to visit.    BP 114/70 (BP Location: Left Arm, Patient Position: Sitting, Cuff Size: Normal)    Pulse 80    Temp (!) 96.7 F (35.9 C) (Temporal)    Wt 160 lb 8 oz (72.8 kg)    SpO2 100%    BMI 27.99 kg/m    Objective:   Physical Exam  Musculoskeletal:     Right knee: No swelling, deformity, erythema or bony tenderness. Normal range of motion. No tenderness. No MCL laxity, ACL laxity or PCL laxity.     Instability Tests: Anterior drawer test negative.     Left knee: No swelling, deformity, erythema or bony tenderness. Normal range of motion. No tenderness.       Legs:  Neurological: She is alert.  Skin: Skin is warm and dry. No erythema.           Assessment & Plan:

## 2019-09-14 ENCOUNTER — Encounter: Payer: Self-pay | Admitting: Internal Medicine

## 2019-10-17 ENCOUNTER — Other Ambulatory Visit: Payer: Self-pay | Admitting: Internal Medicine

## 2019-10-17 DIAGNOSIS — G43901 Migraine, unspecified, not intractable, with status migrainosus: Secondary | ICD-10-CM

## 2019-11-09 ENCOUNTER — Encounter: Payer: 59 | Admitting: Internal Medicine

## 2019-12-21 ENCOUNTER — Encounter: Payer: Self-pay | Admitting: Internal Medicine

## 2019-12-21 ENCOUNTER — Other Ambulatory Visit: Payer: Self-pay

## 2019-12-21 ENCOUNTER — Ambulatory Visit (AMBULATORY_SURGERY_CENTER): Payer: 59 | Admitting: *Deleted

## 2019-12-21 VITALS — Ht 64.0 in | Wt 150.0 lb

## 2019-12-21 DIAGNOSIS — Z86010 Personal history of colonic polyps: Secondary | ICD-10-CM

## 2019-12-21 NOTE — Progress Notes (Signed)
covid vax x3- had booster   No egg or soy allergy known to patient  No issues with past sedation with any surgeries or procedures no intubation problems in the past  No FH of Malignant Hyperthermia No diet pills per patient No home 02 use per patient  No blood thinners per patient  Pt denies issues with constipation  No A fib or A flutter  EMMI video to pt or via [place] 19 guidelines implemented in PV today with Pt and RN   Pt verified name, DOB, address and insurance during PV today. Pt mailed instruction packet to included paper to complete and mail back to [facility] with addressed and stamped envelope, Emmi video, copy of consent form to read and not return, and instructions. PV completed over the phone. Pt encouraged to call with questions or issues - sent instructions via my chart as well as mail    Due to the COVID-19 pandemic we are asking patients to follow these guidelines. Please only bring one care partner. Please be aware that your care partner may wait in the car in the parking lot or if they feel like they will be too hot to wait in the car, they may wait in the lobby on the 4th floor. All care partners are required to wear a mask the entire time (we do not have any that we can provide them), they need to practice social distancing, and we will do a Covid check for all patient's and care partners when you arrive. Also we will check their temperature and your temperature. If the care partner waits in their car they need to stay in the parking lot the entire time and we will call them on their cell phone when the patient is ready for discharge so they can bring the car to the front of the building. Also all patient's will need to wear a mask into building.

## 2020-01-04 ENCOUNTER — Encounter: Payer: 59 | Admitting: Internal Medicine

## 2020-01-04 ENCOUNTER — Other Ambulatory Visit: Payer: Self-pay

## 2020-01-04 ENCOUNTER — Encounter: Payer: Self-pay | Admitting: Internal Medicine

## 2020-01-04 ENCOUNTER — Ambulatory Visit (AMBULATORY_SURGERY_CENTER): Payer: 59 | Admitting: Internal Medicine

## 2020-01-04 VITALS — BP 115/68 | HR 77 | Temp 98.9°F | Resp 14 | Ht 64.0 in | Wt 155.0 lb

## 2020-01-04 DIAGNOSIS — Z86010 Personal history of colonic polyps: Secondary | ICD-10-CM

## 2020-01-04 DIAGNOSIS — D122 Benign neoplasm of ascending colon: Secondary | ICD-10-CM

## 2020-01-04 DIAGNOSIS — K5140 Inflammatory polyps of colon without complications: Secondary | ICD-10-CM | POA: Diagnosis not present

## 2020-01-04 MED ORDER — SODIUM CHLORIDE 0.9 % IV SOLN: 500.0000 mL | Freq: Once | INTRAVENOUS | Status: DC

## 2020-01-04 NOTE — Op Note (Signed)
[facility] Patient Name: Karen Munoz Procedure Date: 01/04/2020 8:01 AM MRN: [id number] Endoscopist: Jerene Bears , MD Age: 59 Referring MD:  Date of Birth: 05-Jun-1960 Gender: Female Account #: [id number] Procedure:                Colonoscopy Indications:              High risk colon cancer surveillance: Personal                            history of non-advanced adenoma and sessile                            serrated polyp, Last colonoscopy: July 2015 Medicines:                Monitored Anesthesia Care Procedure:                Pre-Anesthesia Assessment:                           - Prior to the procedure, a History and Physical                            was performed, and patient medications and                            allergies were reviewed. The patient's tolerance of                            previous anesthesia was also reviewed. The risks                            and benefits of the procedure and the sedation                            options and risks were discussed with the patient.                            All questions were answered, and informed consent                            was obtained. Prior Anticoagulants: The patient has                            taken no previous anticoagulant or antiplatelet                            agents. ASA Grade Assessment: II - A patient with                            mild systemic disease. After reviewing the risks                            and benefits, the patient was deemed in  satisfactory condition to undergo the procedure.                           After obtaining informed consent, the colonoscope                            was passed under direct vision. Throughout the                            procedure, the patient's blood pressure, pulse, and                            oxygen saturations were monitored continuously. The                            Colonoscope was introduced  through the anus and                            advanced to the cecum, identified by appendiceal                            orifice and ileocecal valve. The colonoscopy was                            performed without difficulty. The patient tolerated                            the procedure well. The quality of the bowel                            preparation was good. The ileocecal valve,                            appendiceal orifice, and rectum were photographed. Scope In: 8:06:33 AM Scope Out: 8:23:07 AM Scope Withdrawal Time: 0 hours 12 minutes 43 seconds  Total Procedure Duration: 0 hours 16 minutes 34 seconds  Findings:                 The digital rectal exam was normal.                           A 5 mm polyp was found in the ascending colon. The                            polyp was sessile. The polyp was removed with a                            cold snare. Resection and retrieval were complete.                           Multiple small-mouthed diverticula were found in                            the sigmoid colon.  Internal hemorrhoids were found during                            retroflexion. The hemorrhoids were small. Complications:            No immediate complications. Estimated Blood Loss:     Estimated blood loss was minimal. Impression:               - One 5 mm polyp in the ascending colon, removed                            with a cold snare. Resected and retrieved.                           - Diverticulosis in the sigmoid colon.                           - Small internal hemorrhoids. Recommendation:           - Patient has a contact number available for                            emergencies. The signs and symptoms of potential                            delayed complications were discussed with the                            patient. Return to normal activities tomorrow.                            Written discharge instructions were provided  to the                            patient.                           - Resume previous diet.                           - Continue present medications.                           - Await pathology results.                           - Repeat colonoscopy is recommended for                            surveillance. The colonoscopy date will be                            determined after pathology results from today's                            exam become available for review. Jerene Bears, MD 01/04/2020 8:26:07 AM This report has been signed electronically.

## 2020-01-04 NOTE — Progress Notes (Signed)
Pt's states no medical or surgical changes since previsit or office visit. 

## 2020-01-04 NOTE — Patient Instructions (Signed)
.  Handout on polyps, diverticulosis given.    YOU HAD AN ENDOSCOPIC PROCEDURE TODAY AT THE [facility]:   Refer to the procedure report that was given to you for any specific questions about what was found during the examination.  If the procedure report does not answer your questions, please call your gastroenterologist to clarify.  If you requested that your care partner not be given the details of your procedure findings, then the procedure report has been included in a sealed envelope for you to review at your convenience later.  YOU SHOULD EXPECT: Some feelings of bloating in the abdomen. Passage of more gas than usual.  Walking can help get rid of the air that was put into your GI tract during the procedure and reduce the bloating. If you had a lower endoscopy (such as a colonoscopy or flexible sigmoidoscopy) you may notice spotting of blood in your stool or on the toilet paper. If you underwent a bowel prep for your procedure, you may not have a normal bowel movement for a few days.  Please Note:  You might notice some irritation and congestion in your nose or some drainage.  This is from the oxygen used during your procedure.  There is no need for concern and it should clear up in a day or so.  SYMPTOMS TO REPORT IMMEDIATELY:   Following lower endoscopy (colonoscopy or flexible sigmoidoscopy):  Excessive amounts of blood in the stool  Significant tenderness or worsening of abdominal pains  Swelling of the abdomen that is new, acute  Fever of 100F or higher   For urgent or emergent issues, a gastroenterologist can be reached at any hour by calling [phone]. Do not use MyChart messaging for urgent concerns.    DIET:  We do recommend a small meal at first, but then you may proceed to your regular diet.  Drink plenty of fluids but you should avoid alcoholic beverages for 24 hours.  ACTIVITY:  You should plan to take it easy for the rest of today and you should NOT  DRIVE or use heavy machinery until tomorrow (because of the sedation medicines used during the test).    FOLLOW UP: Our staff will call the number listed on your records 48-72 hours following your procedure to check on you and address any questions or concerns that you may have regarding the information given to you following your procedure. If we do not reach you, we will leave a message.  We will attempt to reach you two times.  During this call, we will ask if you have developed any symptoms of COVID 19. If you develop any symptoms (ie: fever, flu-like symptoms, shortness of breath, cough etc.) before then, please call [phone].  If you test positive for Covid 19 in the 2 weeks post procedure, please call and report this information to us.    If any biopsies were taken you will be contacted by phone or by letter within the next 1-3 weeks.  Please call us at [phone] if you have not heard about the biopsies in 3 weeks.    SIGNATURES/CONFIDENTIALITY: You and/or your care partner have signed paperwork which will be entered into your electronic medical record.  These signatures attest to the fact that that the information above on your After Visit Summary has been reviewed and is understood.  Full responsibility of the confidentiality of this discharge information lies with you and/or your care-partner. 

## 2020-01-04 NOTE — Progress Notes (Signed)
Called to room to assist during endoscopic procedure.  Patient ID and intended procedure confirmed with present staff. Received instructions for my participation in the procedure from the performing physician.  

## 2020-01-04 NOTE — Progress Notes (Signed)
A and O x3. Report to RN. Tolerated MAC anesthesia well.

## 2020-01-06 ENCOUNTER — Other Ambulatory Visit: Payer: Self-pay | Admitting: Primary Care

## 2020-01-06 ENCOUNTER — Telehealth: Payer: Self-pay | Admitting: *Deleted

## 2020-01-06 DIAGNOSIS — E785 Hyperlipidemia, unspecified: Secondary | ICD-10-CM

## 2020-01-06 NOTE — Telephone Encounter (Signed)
  Follow up Call-  Call back number 01/04/2020  Post procedure Call Back phone  # [phone]  Permission to leave phone message Yes  Some recent data might be hidden     Patient questions:  Do you have a fever, pain , or abdominal swelling? No. Pain Score  0 *  Have you tolerated food without any problems? Yes.    Have you been able to return to your normal activities? Yes.    Do you have any questions about your discharge instructions: Diet   No. Medications  No. Follow up visit  No.  Do you have questions or concerns about your Care? No.  Actions: * If pain score is 4 or above: No action needed, pain <4.  1. Have you developed a fever since your procedure? no  2.   Have you had an respiratory symptoms (SOB or cough) since your procedure? no  3.   Have you tested positive for COVID 19 since your procedure no  4.   Have you had any family members/close contacts diagnosed with the COVID 19 since your procedure? no   If yes to any of these questions please route to Joylene John, RN and Joella Prince, RN

## 2020-01-10 ENCOUNTER — Telehealth: Payer: Self-pay

## 2020-01-10 ENCOUNTER — Other Ambulatory Visit: Payer: 59

## 2020-01-10 NOTE — Telephone Encounter (Signed)
Pt already called to [facility]sc.

## 2020-01-10 NOTE — Telephone Encounter (Signed)
[place] Night - Client Nonclinical Telephone Record AccessNurse Client [facility] Night - Client Client Site [place] Physician Alma Friendly - NP Contact Type Call Who Is Calling Patient / Member / Family / Caregiver Caller Name Cailynn Bodnar Caller Phone Number [phone] Patient Name Karen Munoz Patient DOB 03/02/1961 Call Type Message Only Information Provided Reason for Call Request to [place] Appointment Initial Comment (1/2)Caller states her and her husband have labs this morning at 820am. She is having a hard time getting him ready and doesn't think they will be able to make it. Additional Comment Caller declined triage and office hours were provided. Disp. Time Disposition Final User 01/10/2020 7:23:30 AM General Information Provided Yes Christel Mormon Call Closed By: Christel Mormon Transaction Date/Time: 01/10/2020 7:19:30 AM (ET)

## 2020-01-11 ENCOUNTER — Encounter: Payer: Self-pay | Admitting: Internal Medicine

## 2020-01-12 ENCOUNTER — Other Ambulatory Visit: Payer: Self-pay

## 2020-01-12 ENCOUNTER — Other Ambulatory Visit (INDEPENDENT_AMBULATORY_CARE_PROVIDER_SITE_OTHER): Payer: 59

## 2020-01-12 DIAGNOSIS — E785 Hyperlipidemia, unspecified: Secondary | ICD-10-CM | POA: Diagnosis not present

## 2020-01-12 LAB — CBC
HCT: 41.2 % (ref 36.0–46.0)
Hemoglobin: 13.7 g/dL (ref 12.0–15.0)
MCHC: 33.1 g/dL (ref 30.0–36.0)
MCV: 89 fl (ref 78.0–100.0)
Platelets: 252 10*3/uL (ref 150.0–400.0)
RBC: 4.63 Mil/uL (ref 3.87–5.11)
RDW: 13 % (ref 11.5–15.5)
WBC: 4.3 10*3/uL (ref 4.0–10.5)

## 2020-01-12 LAB — COMPREHENSIVE METABOLIC PANEL
ALT: 11 U/L (ref 0–35)
AST: 15 U/L (ref 0–37)
Albumin: 4.3 g/dL (ref 3.5–5.2)
Alkaline Phosphatase: 70 U/L (ref 39–117)
BUN: 10 mg/dL (ref 6–23)
CO2: 30 mEq/L (ref 19–32)
Calcium: 9.3 mg/dL (ref 8.4–10.5)
Chloride: 103 mEq/L (ref 96–112)
Creatinine, Ser: 0.77 mg/dL (ref 0.40–1.20)
GFR: 85.74 mL/min (ref >60.00)
Glucose, Bld: 92 mg/dL (ref 70–99)
Potassium: 4.2 mEq/L (ref 3.5–5.1)
Sodium: 138 mEq/L (ref 135–145)
Total Bilirubin: 0.4 mg/dL (ref 0.2–1.2)
Total Protein: 6.9 g/dL (ref 6.0–8.3)

## 2020-01-12 LAB — LIPID PANEL
Cholesterol: 273 mg/dL — ABNORMAL HIGH (ref 0–200)
HDL: 93.1 mg/dL (ref >39.00)
LDL Cholesterol: 167 mg/dL — ABNORMAL HIGH (ref 0–99)
NonHDL: 180.33
Total CHOL/HDL Ratio: 3
Triglycerides: 66 mg/dL (ref 0.0–149.0)
VLDL: 13.2 mg/dL (ref 0.0–40.0)

## 2020-01-13 ENCOUNTER — Other Ambulatory Visit: Payer: Self-pay | Admitting: Internal Medicine

## 2020-01-17 ENCOUNTER — Ambulatory Visit: Payer: 59 | Admitting: Primary Care

## 2020-01-17 ENCOUNTER — Other Ambulatory Visit: Payer: Self-pay

## 2020-01-17 ENCOUNTER — Other Ambulatory Visit: Payer: Self-pay | Admitting: Internal Medicine

## 2020-01-17 ENCOUNTER — Encounter: Payer: Self-pay | Admitting: Primary Care

## 2020-01-17 VITALS — BP 100/72 | HR 94 | Temp 97.7°F | Ht 64.02 in | Wt 157.0 lb

## 2020-01-17 DIAGNOSIS — F411 Generalized anxiety disorder: Secondary | ICD-10-CM

## 2020-01-17 DIAGNOSIS — G43901 Migraine, unspecified, not intractable, with status migrainosus: Secondary | ICD-10-CM

## 2020-01-17 DIAGNOSIS — E785 Hyperlipidemia, unspecified: Secondary | ICD-10-CM

## 2020-01-17 DIAGNOSIS — Z0000 Encounter for general adult medical examination without abnormal findings: Secondary | ICD-10-CM | POA: Diagnosis not present

## 2020-01-17 DIAGNOSIS — Z1231 Encounter for screening mammogram for malignant neoplasm of breast: Secondary | ICD-10-CM | POA: Diagnosis not present

## 2020-01-17 DIAGNOSIS — Z23 Encounter for immunization: Secondary | ICD-10-CM

## 2020-01-17 DIAGNOSIS — M25561 Pain in right knee: Secondary | ICD-10-CM

## 2020-01-17 DIAGNOSIS — G8929 Other chronic pain: Secondary | ICD-10-CM

## 2020-01-17 MED ORDER — FLUOXETINE HCL 40 MG PO CAPS: 40.0000 mg | ORAL_CAPSULE | Freq: Every day | ORAL | 3 refills | Status: DC

## 2020-01-17 NOTE — Patient Instructions (Signed)
Start exercising. You should be getting 150 minutes of moderate intensity exercise weekly.  It's important to improve your diet by reducing consumption of fast food, fried food, processed snack foods, sugary drinks. Increase consumption of fresh vegetables and fruits, whole grains, water.  Ensure you are drinking 64 ounces of water daily.  Call the [facility] to schedule your mammogram.   Schedule a lab appointment for 6 months to repeat your cholesterol level.  It was a pleasure to see you today!   Preventive Care 57-29 Years Old, Female Preventive care refers to visits with your health care provider and lifestyle choices that can promote health and wellness. This includes:  A yearly physical exam. This may also be called an annual well check.  Regular dental visits and eye exams.  Immunizations.  Screening for certain conditions.  Healthy lifestyle choices, such as eating a healthy diet, getting regular exercise, not using drugs or products that contain nicotine and tobacco, and limiting alcohol use. What can I expect for my preventive care visit? Physical exam Your health care provider will check your:  Height and weight. This may be used to calculate body mass index (BMI), which tells if you are at a healthy weight.  Heart rate and blood pressure.  Skin for abnormal spots. Counseling Your health care provider may ask you questions about your:  Alcohol, tobacco, and drug use.  Emotional well-being.  Home and relationship well-being.  Sexual activity.  Eating habits.  Work and work Statistician.  Method of birth control.  Menstrual cycle.  Pregnancy history. What immunizations do I need?  Influenza (flu) vaccine  This is recommended every year. Tetanus, diphtheria, and pertussis (Tdap) vaccine  You may need a Td booster every 10 years. Varicella (chickenpox) vaccine  You may need this if you have not been vaccinated. Zoster (shingles)  vaccine  You may need this after age 71. Measles, mumps, and rubella (MMR) vaccine  You may need at least one dose of MMR if you were born in 1957 or later. You may also need a second dose. Pneumococcal conjugate (PCV13) vaccine  You may need this if you have certain conditions and were not previously vaccinated. Pneumococcal polysaccharide (PPSV23) vaccine  You may need one or two doses if you smoke cigarettes or if you have certain conditions. Meningococcal conjugate (MenACWY) vaccine  You may need this if you have certain conditions. Hepatitis A vaccine  You may need this if you have certain conditions or if you travel or work in places where you may be exposed to hepatitis A. Hepatitis B vaccine  You may need this if you have certain conditions or if you travel or work in places where you may be exposed to hepatitis B. Haemophilus influenzae type b (Hib) vaccine  You may need this if you have certain conditions. Human papillomavirus (HPV) vaccine  If recommended by your health care provider, you may need three doses over 6 months. You may receive vaccines as individual doses or as more than one vaccine together in one shot (combination vaccines). Talk with your health care provider about the risks and benefits of combination vaccines. What tests do I need? Blood tests  Lipid and cholesterol levels. These may be checked every 5 years, or more frequently if you are over 99 years old.  Hepatitis C test.  Hepatitis B test. Screening  Lung cancer screening. You may have this screening every year starting at age 51 if you have a 30-pack-year history of smoking and  currently smoke or have quit within the past 15 years.  Colorectal cancer screening. All adults should have this screening starting at age 4 and continuing until age 55. Your health care provider may recommend screening at age 57 if you are at increased risk. You will have tests every 1-10 years, depending on your  results and the type of screening test.  Diabetes screening. This is done by checking your blood sugar (glucose) after you have not eaten for a while (fasting). You may have this done every 1-3 years.  Mammogram. This may be done every 1-2 years. Talk with your health care provider about when you should start having regular mammograms. This may depend on whether you have a family history of breast cancer.  BRCA-related cancer screening. This may be done if you have a family history of breast, ovarian, tubal, or peritoneal cancers.  Pelvic exam and Pap test. This may be done every 3 years starting at age 80. Starting at age 13, this may be done every 5 years if you have a Pap test in combination with an HPV test. Other tests  Sexually transmitted disease (STD) testing.  Bone density scan. This is done to screen for osteoporosis. You may have this scan if you are at high risk for osteoporosis. Follow these instructions at home: Eating and drinking  Eat a diet that includes fresh fruits and vegetables, whole grains, lean protein, and low-fat dairy.  Take vitamin and mineral supplements as recommended by your health care provider.  Do not drink alcohol if: ? Your health care provider tells you not to drink. ? You are pregnant, may be pregnant, or are planning to become pregnant.  If you drink alcohol: ? Limit how much you have to 0-1 drink a day. ? Be aware of how much alcohol is in your drink. In the [place] one drink equals one 12 oz bottle of beer (355 mL), one 5 oz glass of wine (148 mL), or one 1 oz glass of hard liquor (44 mL). Lifestyle  Take daily care of your teeth and gums.  Stay active. Exercise for at least 30 minutes on 5 or more days each week.  Do not use any products that contain nicotine or tobacco, such as cigarettes, e-cigarettes, and chewing tobacco. If you need help quitting, ask your health care provider.  If you are sexually active, practice safe sex. Use a  condom or other form of birth control (contraception) in order to prevent pregnancy and STIs (sexually transmitted infections).  If told by your health care provider, take low-dose aspirin daily starting at age 43. What's next?  Visit your health care provider once a year for a well check visit.  Ask your health care provider how often you should have your eyes and teeth checked.  Stay up to date on all vaccines. This information is not intended to replace advice given to you by your health care provider. Make sure you discuss any questions you have with your health care provider. Document Revised: 11/06/2017 Document Reviewed: 11/06/2017 Elsevier Patient Education  2020 [employer].

## 2020-01-17 NOTE — Progress Notes (Signed)
Subjective:    Patient ID: Karen Munoz, female    DOB: 11/01/1960, 59 y.o.   MRN: [id number]  HPI  This visit occurred during the SARS-CoV-2 public health emergency.  Safety protocols were in place, including screening questions prior to the visit, additional usage of staff PPE, and extensive cleaning of exam room while observing appropriate contact time as indicated for disinfecting solutions.   Karen Munoz is a 59 year old female who presents today for complete physical.  Overall doing well on fluoxetine 20 mg for which she's been taking for several years. She would like dose increase as she has difficulty falling asleep due to mind racing thoughts also an increase in anxiety during the day. She's tried Melatonin which is now ineffective.   The 10-year ASCVD risk score [name]., et al., 2013) is: 1.3%   Values used to calculate the score:     Age: 26 years     Sex: Female     Is Non-Hispanic African American: No     Diabetic: No     Tobacco smoker: No     Systolic Blood Pressure: 782 mmHg     Is BP treated: No     HDL Cholesterol: 93.1 mg/dL     Total Cholesterol: 273 mg/dL   Immunizations: -Tetanus: Completed in 2018 -Influenza: Due -Shingles: Never had chicken pox. -Covid-19: Completed series   Diet: She endorses a fair diet.  Exercise: No regular exercise.  Eye exam: Completed in 2021 Dental exam: Completes semi-annually   Pap Smear: Completed in 2019 Mammogram: Completed in 2020 Colonoscopy: Completed in 2021, due in 2031 Hep C Screen: Due  BP Readings from Last 3 Encounters:  01/17/20 100/72  01/04/20 115/68  09/03/19 114/70      Review of Systems  Constitutional: Negative for unexpected weight change.  HENT: Negative for rhinorrhea.   Eyes: Negative for visual disturbance.  Respiratory: Negative for cough and shortness of breath.   Cardiovascular: Negative for chest pain.  Gastrointestinal: Negative for constipation and diarrhea.  Genitourinary:  Negative for difficulty urinating.  Musculoskeletal: Negative for arthralgias.  Skin: Negative for rash.  Allergic/Immunologic: Negative for environmental allergies.  Neurological: Positive for headaches. Negative for dizziness and numbness.  Psychiatric/Behavioral: The patient is nervous/anxious.        Past Medical History:  Diagnosis Date  . Allergy   . Anxiety   . Depression      Social History   Socioeconomic History  . Marital status: Unknown    Spouse name: Not on file  . Number of children: Not on file  . Years of education: Not on file  . Highest education level: Not on file  Occupational History  . Not on file  Tobacco Use  . Smoking status: Former Research scientist (life sciences)  . Smokeless tobacco: Never Used  Substance and Sexual Activity  . Alcohol use: Yes    Comment: occasionally on  monthly basis  . Drug use: No  . Sexual activity: Not on file  Other Topics Concern  . Not on file  Social History Narrative  . Not on file   Social Determinants of Health   Financial Resource Strain:   . Difficulty of Paying Living Expenses: Not on file  Food Insecurity:   . Worried About Charity fundraiser in the Last Year: Not on file  . Ran Out of Food in the Last Year: Not on file  Transportation Needs:   . Lack of Transportation (Medical): Not on file  .  Lack of Transportation (Non-Medical): Not on file  Physical Activity:   . Days of Exercise per Week: Not on file  . Minutes of Exercise per Session: Not on file  Stress:   . Feeling of Stress : Not on file  Social Connections:   . Frequency of Communication with Friends and Family: Not on file  . Frequency of Social Gatherings with Friends and Family: Not on file  . Attends Religious Services: Not on file  . Active Member of Clubs or Organizations: Not on file  . Attends Archivist Meetings: Not on file  . Marital Status: Not on file  Intimate Partner Violence:   . Fear of Current or Ex-Partner: Not on file  .  Emotionally Abused: Not on file  . Physically Abused: Not on file  . Sexually Abused: Not on file    Past Surgical History:  Procedure Laterality Date  . breast lift    . BUNIONECTOMY     x2  . COLONOSCOPY  2015  . POLYPECTOMY     2015- SSP x1. TA x 1   . WISDOM TOOTH EXTRACTION      Family History  Problem Relation Age of Onset  . Colon cancer Maternal Grandfather   . Renal Disease Mother   . COPD Mother   . Hypertension Mother   . Colon polyps Neg Hx   . Esophageal cancer Neg Hx   . Rectal cancer Neg Hx   . Stomach cancer Neg Hx     Allergies  Allergen Reactions  . Penicillins Rash    Current Outpatient Medications on File Prior to Visit  Medication Sig Dispense Refill  . Cholecalciferol (D3 ADULT PO) Take 125 mcg by mouth daily.    . Coenzyme Q10 (CO Q 10 PO) Take 30 mg by mouth daily. 15     . Crisaborole (EUCRISA) 2 % OINT Eucrisa 2 % topical ointment    . Cyanocobalamin (B-12 PO) Take 1,000 mcg by mouth daily.    . cyclobenzaprine (FLEXERIL) 5 MG tablet TAKE 1 TABLET BY MOUTH AT BEDTIME AS NEEDED FOR MIGRAINES/HEADACHES. 90 tablet 0  . diclofenac Sodium (VOLTAREN) 1 % GEL Apply 2 g topically 3 (three) times daily as needed. 100 g 0  . frovatriptan (FROVA) 2.5 MG tablet TAKE 1 TABLET AT MIGRAINE ONSET. MAY REPEAT IN 2 HOURS IF NO IMPROVEMENT. DO NOT EXCEED 2 TABS/24HR 10 tablet 0  . ibuprofen (ADVIL,MOTRIN) 800 MG tablet Take 800 mg by mouth.    . niacin 250 MG tablet Take 250 mg by mouth at bedtime.    . Omega-3 Fatty Acids (FISH OIL PO) Take 690 mg by mouth.     . PSYLLIUM PO Take 1,500 mg by mouth daily.    . Red Yeast Rice Extract (RED YEAST RICE PO) Take 1,200 mg by mouth daily. 1.2gms     . [DISCONTINUED] FLUoxetine (PROZAC) 20 MG capsule TAKE 1 CAPSULE BY MOUTH EVERY DAY 90 capsule 0   No current facility-administered medications on file prior to visit.    BP 100/72 (BP Location: Left Arm, Patient Position: Sitting)   Pulse 94   Temp 97.7 F (36.5 C)    Ht 5' 4.02" (1.626 m)   Wt 157 lb (71.2 kg)   SpO2 99%   BMI 26.94 kg/m    Objective:   Physical Exam HENT:     Right Ear: Tympanic membrane and ear canal normal.     Left Ear: Tympanic membrane and ear canal normal.  Eyes:     Pupils: Pupils are equal, round, and reactive to light.  Cardiovascular:     Rate and Rhythm: Normal rate and regular rhythm.  Pulmonary:     Effort: Pulmonary effort is normal.     Breath sounds: Normal breath sounds.  Abdominal:     General: Bowel sounds are normal.     Palpations: Abdomen is soft.     Tenderness: There is no abdominal tenderness.  Musculoskeletal:        General: Normal range of motion.     Cervical back: Neck supple.  Skin:    General: Skin is warm and dry.  Neurological:     Mental Status: She is alert and oriented to person, place, and time.     Cranial Nerves: No cranial nerve deficit.     Deep Tendon Reflexes:     Reflex Scores:      Patellar reflexes are 2+ on the right side and 2+ on the left side. Psychiatric:        Mood and Affect: Mood normal.            Assessment & Plan:

## 2020-01-17 NOTE — Assessment & Plan Note (Signed)
Increased symptoms over the last year, not sleeping well due to mind racing thoughts.   Increase dose of fluoxetine to 40 mg. She will update.

## 2020-01-17 NOTE — Assessment & Plan Note (Signed)
Immunizations UTD. Colonoscopy UTD, due in 2031. Pap smear UTD, due in 2022. Mammogram due, order placed.  Discussed the importance of a healthy diet and regular exercise in order for weight loss, and to reduce the risk of any potential medical problems.  Exam today unremarkable. Labs reviewed.

## 2020-01-17 NOTE — Assessment & Plan Note (Signed)
Overall stable, has not required use of Voltaren Gel. Continue to monitor.

## 2020-01-17 NOTE — Assessment & Plan Note (Signed)
Significant increase in LDL this year compared to prior years. She admits to a poor diet and is not exercising.  She will work on lifestyle changes and we will repeat lipids in 6 months.

## 2020-01-17 NOTE — Assessment & Plan Note (Signed)
Overall stable. Using Flexeril and Frova sparingly. Continue to monitor.

## 2020-01-20 NOTE — Telephone Encounter (Signed)
Yes, her husband's last name is different than hers. I updated his vaccinations.

## 2020-01-24 ENCOUNTER — Other Ambulatory Visit: Payer: Self-pay | Admitting: Primary Care

## 2020-01-24 DIAGNOSIS — G43701 Chronic migraine without aura, not intractable, with status migrainosus: Secondary | ICD-10-CM

## 2020-02-09 ENCOUNTER — Other Ambulatory Visit: Payer: Self-pay | Admitting: Primary Care

## 2020-02-09 DIAGNOSIS — Z1231 Encounter for screening mammogram for malignant neoplasm of breast: Secondary | ICD-10-CM

## 2020-02-17 ENCOUNTER — Other Ambulatory Visit: Payer: Self-pay

## 2020-02-17 ENCOUNTER — Ambulatory Visit
Admission: RE | Admit: 2020-02-17 | Discharge: 2020-02-17 | Disposition: A | Discharge status: Home / Self Care | Payer: 59 | Source: Ambulatory Visit

## 2020-02-17 DIAGNOSIS — Z1231 Encounter for screening mammogram for malignant neoplasm of breast: Secondary | ICD-10-CM

## 2020-07-14 ENCOUNTER — Other Ambulatory Visit: Payer: Self-pay | Admitting: Primary Care

## 2020-07-14 DIAGNOSIS — G43901 Migraine, unspecified, not intractable, with status migrainosus: Secondary | ICD-10-CM

## 2020-07-17 NOTE — Telephone Encounter (Signed)
Then how often is she taking this? It shouldn't be taken everyday, looks like she's taking several times weekly (if not daily) to be requesting a refill.  If she is having frequent headaches/migraines and we need to meet to discuss better treatment.  Let me know.

## 2020-07-17 NOTE — Telephone Encounter (Signed)
Does she actually need refills? She uses cyclobenzaprine sparingly if needed for migraines.  She really shouldn't need a refill.

## 2020-07-17 NOTE — Telephone Encounter (Signed)
Called patient she did request refill. Would like to have refill sent in

## 2020-07-18 NOTE — Telephone Encounter (Signed)
Called patient states she is only taking PRN 1-2 times a week. She is not out completely but will be soon.

## 2020-09-14 ENCOUNTER — Other Ambulatory Visit: Payer: Self-pay | Admitting: Primary Care

## 2020-09-14 DIAGNOSIS — G43901 Migraine, unspecified, not intractable, with status migrainosus: Secondary | ICD-10-CM

## 2020-09-28 ENCOUNTER — Other Ambulatory Visit: Payer: Self-pay | Admitting: Primary Care

## 2020-10-11 ENCOUNTER — Encounter: Payer: Self-pay | Admitting: Primary Care

## 2020-12-05 ENCOUNTER — Other Ambulatory Visit: Payer: Self-pay | Admitting: Primary Care

## 2020-12-05 DIAGNOSIS — F411 Generalized anxiety disorder: Secondary | ICD-10-CM

## 2020-12-29 ENCOUNTER — Other Ambulatory Visit: Payer: Self-pay | Admitting: Primary Care

## 2020-12-29 DIAGNOSIS — F411 Generalized anxiety disorder: Secondary | ICD-10-CM

## 2021-01-26 ENCOUNTER — Other Ambulatory Visit: Payer: Self-pay | Admitting: Primary Care

## 2021-01-26 DIAGNOSIS — F411 Generalized anxiety disorder: Secondary | ICD-10-CM

## 2021-02-15 ENCOUNTER — Other Ambulatory Visit (HOSPITAL_COMMUNITY)
Admission: RE | Admit: 2021-02-15 | Discharge: 2021-02-15 | Disposition: A | Discharge status: Home / Self Care | Payer: 59 | Source: Ambulatory Visit | Attending: Primary Care | Admitting: Primary Care

## 2021-02-15 ENCOUNTER — Encounter: Payer: Self-pay | Admitting: Primary Care

## 2021-02-15 ENCOUNTER — Other Ambulatory Visit: Payer: Self-pay

## 2021-02-15 ENCOUNTER — Ambulatory Visit (INDEPENDENT_AMBULATORY_CARE_PROVIDER_SITE_OTHER): Payer: 59 | Admitting: Primary Care

## 2021-02-15 VITALS — BP 106/62 | HR 87 | Temp 97.6°F | Ht 64.0 in | Wt 151.0 lb

## 2021-02-15 DIAGNOSIS — E785 Hyperlipidemia, unspecified: Secondary | ICD-10-CM | POA: Diagnosis not present

## 2021-02-15 DIAGNOSIS — Z124 Encounter for screening for malignant neoplasm of cervix: Secondary | ICD-10-CM

## 2021-02-15 DIAGNOSIS — G43701 Chronic migraine without aura, not intractable, with status migrainosus: Secondary | ICD-10-CM | POA: Diagnosis not present

## 2021-02-15 DIAGNOSIS — Z1231 Encounter for screening mammogram for malignant neoplasm of breast: Secondary | ICD-10-CM

## 2021-02-15 DIAGNOSIS — Z1159 Encounter for screening for other viral diseases: Secondary | ICD-10-CM

## 2021-02-15 DIAGNOSIS — R040 Epistaxis: Secondary | ICD-10-CM

## 2021-02-15 DIAGNOSIS — Z0000 Encounter for general adult medical examination without abnormal findings: Secondary | ICD-10-CM | POA: Diagnosis not present

## 2021-02-15 DIAGNOSIS — F411 Generalized anxiety disorder: Secondary | ICD-10-CM

## 2021-02-15 DIAGNOSIS — Z114 Encounter for screening for human immunodeficiency virus [HIV]: Secondary | ICD-10-CM

## 2021-02-15 HISTORY — DX: Epistaxis: R04.0

## 2021-02-15 LAB — CBC
HCT: 40.3 % (ref 36.0–46.0)
Hemoglobin: 13.2 g/dL (ref 12.0–15.0)
MCHC: 32.9 g/dL (ref 30.0–36.0)
MCV: 89.3 fl (ref 78.0–100.0)
Platelets: 244 10*3/uL (ref 150.0–400.0)
RBC: 4.51 Mil/uL (ref 3.87–5.11)
RDW: 13 % (ref 11.5–15.5)
WBC: 3.5 10*3/uL — ABNORMAL LOW (ref 4.0–10.5)

## 2021-02-15 LAB — COMPREHENSIVE METABOLIC PANEL
ALT: 12 U/L (ref 0–35)
AST: 15 U/L (ref 0–37)
Albumin: 4.3 g/dL (ref 3.5–5.2)
Alkaline Phosphatase: 63 U/L (ref 39–117)
BUN: 15 mg/dL (ref 6–23)
CO2: 29 mEq/L (ref 19–32)
Calcium: 9.6 mg/dL (ref 8.4–10.5)
Chloride: 102 mEq/L (ref 96–112)
Creatinine, Ser: 0.8 mg/dL (ref 0.40–1.20)
GFR: 81.27 mL/min (ref >60.00)
Glucose, Bld: 80 mg/dL (ref 70–99)
Potassium: 4 mEq/L (ref 3.5–5.1)
Sodium: 136 mEq/L (ref 135–145)
Total Bilirubin: 0.5 mg/dL (ref 0.2–1.2)
Total Protein: 6.8 g/dL (ref 6.0–8.3)

## 2021-02-15 LAB — LIPID PANEL
Cholesterol: 258 mg/dL — ABNORMAL HIGH (ref 0–200)
HDL: 98.5 mg/dL (ref >39.00)
LDL Cholesterol: 149 mg/dL — ABNORMAL HIGH (ref 0–99)
NonHDL: 159.55
Total CHOL/HDL Ratio: 3
Triglycerides: 55 mg/dL (ref 0.0–149.0)
VLDL: 11 mg/dL (ref 0.0–40.0)

## 2021-02-15 LAB — HEMOGLOBIN A1C: Hgb A1c MFr Bld: 5.5 % (ref 4.6–6.5)

## 2021-02-15 MED ORDER — FROVATRIPTAN SUCCINATE 2.5 MG PO TABS: ORAL_TABLET | ORAL | 0 refills | Status: AC

## 2021-02-15 MED ORDER — FLUOXETINE HCL 40 MG PO CAPS: 40.0000 mg | ORAL_CAPSULE | Freq: Every day | ORAL | 3 refills | Status: DC

## 2021-02-15 NOTE — Progress Notes (Signed)
Subjective:    Patient ID: Karen Munoz, female    DOB: January 23, 1961, 60 y.o.   MRN: [id number]  HPI  Tamila Gaulin is a very pleasant 60 y.o. female who presents today for complete physical and follow up of chronic conditions.  She would also like to mention nosebleeds that occur 2-4 times weekly for the last three months. Last a few minutes. Only to right nares.   She does not use steroid nasal sprays or take antihistamines. She did use a saline spray with improvement. She does not take NSAID's or aspirin. She does not use steroid nasal sprays.   Immunizations: -Tetanus: 2018 -Influenza: Completed this season  -Covid-19: 4 vaccines  -Shingles: Never completed, never had chicken pox.   Diet: Fair diet.  Exercise: No regular exercise.  Eye exam: Completes annually  Dental exam: Completes semi-annually   Pap Smear: Completed in 2019, due today Mammogram: Completed in December 2021 Colonoscopy: Completed in 2021, due 2031  BP Readings from Last 3 Encounters:  02/15/21 106/62  01/17/20 100/72  01/04/20 115/68       Review of Systems  Constitutional:  Negative for unexpected weight change.  HENT:  Positive for nosebleeds. Negative for rhinorrhea.   Eyes:  Negative for visual disturbance.  Respiratory:  Negative for cough and shortness of breath.   Cardiovascular:  Negative for chest pain.  Gastrointestinal:  Negative for constipation and diarrhea.  Genitourinary:  Negative for difficulty urinating.  Musculoskeletal:  Negative for arthralgias and myalgias.  Skin:  Positive for rash.       Mild rash to upper forehead, chronic eczema.   Allergic/Immunologic: Negative for environmental allergies.  Neurological:  Negative for dizziness, numbness and headaches.  Psychiatric/Behavioral:  The patient is not nervous/anxious.         Past Medical History:  Diagnosis Date   Allergy    Anxiety    Depression     Social History   Socioeconomic History   Marital status:  Unknown    Spouse name: Not on file   Number of children: Not on file   Years of education: Not on file   Highest education level: Not on file  Occupational History   Not on file  Tobacco Use   Smoking status: Former   Smokeless tobacco: Never  Substance and Sexual Activity   Alcohol use: Yes    Comment: occasionally on  monthly basis   Drug use: No   Sexual activity: Not on file  Other Topics Concern   Not on file  Social History Narrative   Not on file   Social Determinants of Health   Financial Resource Strain: Not on file  Food Insecurity: Not on file  Transportation Needs: Not on file  Physical Activity: Not on file  Stress: Not on file  Social Connections: Not on file  Intimate Partner Violence: Not on file    Past Surgical History:  Procedure Laterality Date   breast lift     BUNIONECTOMY     x2   COLONOSCOPY  2015   POLYPECTOMY     2015- SSP x1. TA x 1    WISDOM TOOTH EXTRACTION      Family History  Problem Relation Age of Onset   Colon cancer Maternal Grandfather    Renal Disease Mother    COPD Mother    Hypertension Mother    Hyperlipidemia Mother    Hypertension Brother    Colon polyps Neg Hx    Esophageal cancer Neg Hx  Rectal cancer Neg Hx    Stomach cancer Neg Hx     Allergies  Allergen Reactions   Penicillins Rash    Current Outpatient Medications on File Prior to Visit  Medication Sig Dispense Refill   Cholecalciferol (D3 ADULT PO) Take 125 mcg by mouth daily.     Coenzyme Q10 (CO Q 10 PO) Take 30 mg by mouth daily. 15      Cyanocobalamin (B-12 PO) Take 1,000 mcg by mouth daily.     cyclobenzaprine (FLEXERIL) 5 MG tablet TAKE 1 TABLET BY MOUTH AT BEDTIME AS NEEDED FOR MIGRAINES/HEADACHES. 60 tablet 0   diclofenac Sodium (VOLTAREN) 1 % GEL Apply 2 g topically 3 (three) times daily as needed. 100 g 0   FLUoxetine (PROZAC) 40 MG capsule Take 1 capsule (40 mg total) by mouth daily. For anxiety. Office visit required for further  refills. 90 capsule 0   frovatriptan (FROVA) 2.5 MG tablet TAKE 1 TABLET AT MIGRAINE ONSET. MAY REPEAT IN 2 HOURS IF NO IMPROVEMENT. DO NOT EXCEED 2 TABS/24HR 10 tablet 0   ibuprofen (ADVIL,MOTRIN) 800 MG tablet Take 800 mg by mouth.     niacin 250 MG tablet Take 250 mg by mouth at bedtime.     Omega-3 Fatty Acids (FISH OIL PO) Take 690 mg by mouth.      PSYLLIUM PO Take 1,500 mg by mouth daily.     Red Yeast Rice Extract (RED YEAST RICE PO) Take 1,200 mg by mouth daily. 1.2gms      No current facility-administered medications on file prior to visit.    BP 106/62   Pulse 87   Temp 97.6 F (36.4 C) (Temporal)   Ht 5\' 4"  (1.626 m)   Wt 151 lb (68.5 kg)   SpO2 96%   BMI 25.92 kg/m  Objective:   Physical Exam HENT:     Right Ear: Tympanic membrane and ear canal normal.     Left Ear: Tympanic membrane and ear canal normal.     Nose: Nose normal.  Eyes:     Conjunctiva/sclera: Conjunctivae normal.     Pupils: Pupils are equal, round, and reactive to light.  Neck:     Thyroid: No thyromegaly.  Cardiovascular:     Rate and Rhythm: Normal rate and regular rhythm.     Heart sounds: No murmur heard. Pulmonary:     Effort: Pulmonary effort is normal.     Breath sounds: Normal breath sounds. No rales.  Abdominal:     General: Bowel sounds are normal.     Palpations: Abdomen is soft.     Tenderness: There is no abdominal tenderness.  Genitourinary:    Labia:        Right: No tenderness or lesion.        Left: No tenderness or lesion.      Vagina: No vaginal discharge, erythema or tenderness.     Cervix: No cervical motion tenderness or discharge.     Uterus: Normal.      Adnexa: Right adnexa normal and left adnexa normal.  Musculoskeletal:        General: Normal range of motion.     Cervical back: Neck supple.  Lymphadenopathy:     Cervical: No cervical adenopathy.  Skin:    General: Skin is warm and dry.     Findings: No rash.  Neurological:     Mental Status: She is  alert and oriented to person, place, and time.     Cranial Nerves:  No cranial nerve deficit.     Deep Tendon Reflexes: Reflexes are normal and symmetric.  Psychiatric:        Mood and Affect: Mood normal.          Assessment & Plan:      This visit occurred during the SARS-CoV-2 public health emergency.  Safety protocols were in place, including screening questions prior to the visit, additional usage of staff PPE, and extensive cleaning of exam room while observing appropriate contact time as indicated for disinfecting solutions.

## 2021-02-15 NOTE — Patient Instructions (Signed)
Stop by the lab prior to leaving today. I will notify you of your results once received.   [facility].   Try the saline nasal spray for your nosebleeds, update me if they continue.  It was a pleasure to see you today!  Preventive Care 10-58 Years Old, Female Preventive care refers to lifestyle choices and visits with your health care provider that can promote health and wellness. Preventive care visits are also called wellness exams. What can I expect for my preventive care visit? Counseling Your health care provider may ask you questions about your: Medical history, including: Past medical problems. Family medical history. Pregnancy history. Current health, including: Menstrual cycle. Method of birth control. Emotional well-being. Home life and relationship well-being. Sexual activity and sexual health. Lifestyle, including: Alcohol, nicotine or tobacco, and drug use. Access to firearms. Diet, exercise, and sleep habits. Work and work Statistician. Sunscreen use. Safety issues such as seatbelt and bike helmet use. Physical exam Your health care provider will check your: Height and weight. These may be used to calculate your BMI (body mass index). BMI is a measurement that tells if you are at a healthy weight. Waist circumference. This measures the distance around your waistline. This measurement also tells if you are at a healthy weight and may help predict your risk of certain diseases, such as type 2 diabetes and high blood pressure. Heart rate and blood pressure. Body temperature. Skin for abnormal spots. What immunizations do I need? Vaccines are usually given at various ages, according to a schedule. Your health care provider will recommend vaccines for you based on your age, medical history, and lifestyle or other factors, such as travel or where you work. What tests do I need? Screening Your health care provider may recommend  screening tests for certain conditions. This may include: Lipid and cholesterol levels. Diabetes screening. This is done by checking your blood sugar (glucose) after you have not eaten for a while (fasting). Pelvic exam and Pap test. Hepatitis B test. Hepatitis C test. HIV (human immunodeficiency virus) test. STI (sexually transmitted infection) testing, if you are at risk. Lung cancer screening. Colorectal cancer screening. Mammogram. Talk with your health care provider about when you should start having regular mammograms. This may depend on whether you have a family history of breast cancer. BRCA-related cancer screening. This may be done if you have a family history of breast, ovarian, tubal, or peritoneal cancers. Bone density scan. This is done to screen for osteoporosis. Talk with your health care provider about your test results, treatment options, and if necessary, the need for more tests. Follow these instructions at home: Eating and drinking  Eat a diet that includes fresh fruits and vegetables, whole grains, lean protein, and low-fat dairy products. Take vitamin and mineral supplements as recommended by your health care provider. Do not drink alcohol if: Your health care provider tells you not to drink. You are pregnant, may be pregnant, or are planning to become pregnant. If you drink alcohol: Limit how much you have to 0-1 drink a day. Know how much alcohol is in your drink. In the [place] one drink equals one 12 oz bottle of beer (355 mL), one 5 oz glass of wine (148 mL), or one 1 oz glass of hard liquor (44 mL). Lifestyle Brush your teeth every morning and night with fluoride toothpaste. Floss one time each day. Exercise for at least 30 minutes 5 or more days each week. Do not  use any products that contain nicotine or tobacco. These products include cigarettes, chewing tobacco, and vaping devices, such as e-cigarettes. If you need help quitting, ask your health care  provider. Do not use drugs. If you are sexually active, practice safe sex. Use a condom or other form of protection to prevent STIs. If you do not wish to become pregnant, use a form of birth control. If you plan to become pregnant, see your health care provider for a prepregnancy visit. Take aspirin only as told by your health care provider. Make sure that you understand how much to take and what form to take. Work with your health care provider to find out whether it is safe and beneficial for you to take aspirin daily. Find healthy ways to manage stress, such as: Meditation, yoga, or listening to music. Journaling. Talking to a trusted person. Spending time with friends and family. Minimize exposure to UV radiation to reduce your risk of skin cancer. Safety Always wear your seat belt while driving or riding in a vehicle. Do not drive: If you have been drinking alcohol. Do not ride with someone who has been drinking. When you are tired or distracted. While texting. If you have been using any mind-altering substances or drugs. Wear a helmet and other protective equipment during sports activities. If you have firearms in your house, make sure you follow all gun safety procedures. Seek help if you have been physically or sexually abused. What's next? Visit your health care provider once a year for an annual wellness visit. Ask your health care provider how often you should have your eyes and teeth checked. Stay up to date on all vaccines. This information is not intended to replace advice given to you by your health care provider. Make sure you discuss any questions you have with your health care provider. Document Revised: 08/23/2020 Document Reviewed: 08/23/2020 Elsevier Patient Education  [place].

## 2021-02-15 NOTE — Assessment & Plan Note (Signed)
Overall doing well, continue Frovra 2.5 mg and cyclobenzaprine 5 mg PRN.   Refills provided for Frovra.

## 2021-02-15 NOTE — Assessment & Plan Note (Signed)
Since weather changes over last three months.  Resume Saline nasal spray, try routinely for moisture. Discussed to avoid aspirin, NSAID's, steroid nasal sprays.  Consider ENT referral if no improvement. She will update

## 2021-02-15 NOTE — Assessment & Plan Note (Signed)
Immunizations UTD. Pap smear due, completed today. Mammogram due, ordered and pending. Colonoscopy UTD, due 2031.  Discussed the importance of a healthy diet and regular exercise in order for weight loss, and to reduce the risk of further co-morbidity.  Exam today stable. Labs pending.

## 2021-02-15 NOTE — Assessment & Plan Note (Signed)
Overall under good control over Fluoxetine 40 mg, refills provided.

## 2021-02-15 NOTE — Assessment & Plan Note (Signed)
Discussed the importance of a healthy diet and regular exercise in order for weight loss, and to reduce the risk of further co-morbidity.  Repeat lipid panel pending. 

## 2021-02-16 LAB — CYTOLOGY - PAP
Comment: NEGATIVE
Diagnosis: NEGATIVE
High risk HPV: NEGATIVE

## 2021-02-16 LAB — HEPATITIS C ANTIBODY
Hepatitis C Ab: NONREACTIVE
SIGNAL TO CUT-OFF: <0.02 (ref <1.00)

## 2021-02-16 LAB — HIV ANTIBODY (ROUTINE TESTING W REFLEX): HIV 1&2 Ab, 4th Generation: NONREACTIVE

## 2021-03-20 ENCOUNTER — Ambulatory Visit
Admission: RE | Admit: 2021-03-20 | Discharge: 2021-03-20 | Disposition: A | Discharge status: Home / Self Care | Payer: 59 | Source: Ambulatory Visit | Attending: Primary Care | Admitting: Primary Care

## 2021-03-20 DIAGNOSIS — Z1231 Encounter for screening mammogram for malignant neoplasm of breast: Secondary | ICD-10-CM

## 2021-03-22 ENCOUNTER — Other Ambulatory Visit: Payer: Self-pay | Admitting: Primary Care

## 2021-03-22 DIAGNOSIS — R928 Other abnormal and inconclusive findings on diagnostic imaging of breast: Secondary | ICD-10-CM

## 2021-04-12 ENCOUNTER — Ambulatory Visit
Admission: RE | Admit: 2021-04-12 | Discharge: 2021-04-12 | Disposition: A | Discharge status: Home / Self Care | Payer: 59 | Source: Ambulatory Visit | Attending: Primary Care | Admitting: Primary Care

## 2021-04-12 DIAGNOSIS — R928 Other abnormal and inconclusive findings on diagnostic imaging of breast: Secondary | ICD-10-CM

## 2021-06-26 NOTE — Telephone Encounter (Signed)
Spoke with [facility] for 30 minutes, also spoke with patient via phone. ? ?It turns out she had a back supply of fluoxetine for quite some time and was not picking up refills of her prescription on file.  She then became short on medication, pharmacy provided her with 30 days of the next prescription which started the cycle of her receiving too few pills for 90 day supply.  ? ?She has some Lexapro on hand at home, she will take this for 1 month, then she will refill her fluoxetine for 90-day supply which will get her back on track.  She has taken Lexapro before and has done fine.  She will reach out if she experiences any issues on the Lexapro. ?

## 2021-09-10 ENCOUNTER — Telehealth: Payer: Self-pay | Admitting: Primary Care

## 2021-09-10 DIAGNOSIS — R7989 Other specified abnormal findings of blood chemistry: Secondary | ICD-10-CM

## 2021-09-10 NOTE — Telephone Encounter (Signed)
Per my result note from December 2022 I recommended patient return 3 months later for repeat blood draw given slightly lower white blood cell count.  Please set up a lab only appointment at her convenience.

## 2021-09-10 NOTE — Telephone Encounter (Signed)
Patient called and said she couldn't remember what it was for but that she thought that Anda Kraft wanted her to come back for labs after a few months. I dont see any orders or notes right but I could have over looked. Does she need to come in? Call back is [phone]

## 2021-09-12 NOTE — Telephone Encounter (Signed)
Called and scheduled pt for a redraw for next week

## 2021-09-20 ENCOUNTER — Other Ambulatory Visit (INDEPENDENT_AMBULATORY_CARE_PROVIDER_SITE_OTHER): Payer: 59

## 2021-09-20 DIAGNOSIS — R7989 Other specified abnormal findings of blood chemistry: Secondary | ICD-10-CM | POA: Diagnosis not present

## 2021-09-20 LAB — CBC WITH DIFFERENTIAL/PLATELET
Basophils Absolute: 0 10*3/uL (ref 0.0–0.1)
Basophils Relative: 1.3 % (ref 0.0–3.0)
Eosinophils Absolute: 0.1 10*3/uL (ref 0.0–0.7)
Eosinophils Relative: 1.7 % (ref 0.0–5.0)
HCT: 41.7 % (ref 36.0–46.0)
Hemoglobin: 13.6 g/dL (ref 12.0–15.0)
Lymphocytes Relative: 28.4 % (ref 12.0–46.0)
Lymphs Abs: 1 10*3/uL (ref 0.7–4.0)
MCHC: 32.6 g/dL (ref 30.0–36.0)
MCV: 90.5 fl (ref 78.0–100.0)
Monocytes Absolute: 0.4 10*3/uL (ref 0.1–1.0)
Monocytes Relative: 9.7 % (ref 3.0–12.0)
Neutro Abs: 2.1 10*3/uL (ref 1.4–7.7)
Neutrophils Relative %: 58.9 % (ref 43.0–77.0)
Platelets: 247 10*3/uL (ref 150.0–400.0)
RBC: 4.6 Mil/uL (ref 3.87–5.11)
RDW: 12.9 % (ref 11.5–15.5)
WBC: 3.6 10*3/uL — ABNORMAL LOW (ref 4.0–10.5)

## 2021-09-21 ENCOUNTER — Other Ambulatory Visit: Payer: Self-pay | Admitting: Primary Care

## 2021-09-21 DIAGNOSIS — R7989 Other specified abnormal findings of blood chemistry: Secondary | ICD-10-CM

## 2021-09-21 LAB — PATHOLOGIST SMEAR REVIEW

## 2021-12-16 ENCOUNTER — Other Ambulatory Visit: Payer: Self-pay | Admitting: Primary Care

## 2021-12-16 DIAGNOSIS — F411 Generalized anxiety disorder: Secondary | ICD-10-CM

## 2021-12-17 DIAGNOSIS — F411 Generalized anxiety disorder: Secondary | ICD-10-CM

## 2021-12-17 NOTE — Telephone Encounter (Signed)
Patient is due for CPE/follow up in early to mid December 2023, this will be required prior to any further refills.  Please schedule.

## 2021-12-18 ENCOUNTER — Other Ambulatory Visit: Payer: Self-pay | Admitting: Primary Care

## 2021-12-18 DIAGNOSIS — F411 Generalized anxiety disorder: Secondary | ICD-10-CM

## 2021-12-18 MED ORDER — FLUOXETINE HCL 40 MG PO CAPS: 40.0000 mg | ORAL_CAPSULE | Freq: Every day | ORAL | 0 refills | Status: DC

## 2021-12-18 NOTE — Telephone Encounter (Signed)
LVM for patient to call and schedule

## 2022-01-10 ENCOUNTER — Telehealth: Payer: Self-pay | Admitting: Primary Care

## 2022-01-10 DIAGNOSIS — F411 Generalized anxiety disorder: Secondary | ICD-10-CM

## 2022-01-10 NOTE — Telephone Encounter (Signed)
Please call patient:  She should be able to refill her typical prescription for fluoxetine.  I know that she was having some trouble last month but she should be back on track now.  Have her notify us if not.

## 2022-01-11 NOTE — Telephone Encounter (Signed)
Patient notified. She will get refill from her normal prescription.  Will call if she has any issues getting prescription.

## 2022-01-13 ENCOUNTER — Other Ambulatory Visit: Payer: Self-pay | Admitting: Primary Care

## 2022-01-13 DIAGNOSIS — F411 Generalized anxiety disorder: Secondary | ICD-10-CM

## 2022-01-14 ENCOUNTER — Other Ambulatory Visit: Payer: Self-pay | Admitting: Primary Care

## 2022-01-14 DIAGNOSIS — F411 Generalized anxiety disorder: Secondary | ICD-10-CM

## 2022-01-14 MED ORDER — FLUOXETINE HCL 40 MG PO CAPS: 40.0000 mg | ORAL_CAPSULE | Freq: Every day | ORAL | 0 refills | Status: DC

## 2022-01-14 NOTE — Telephone Encounter (Signed)
Noted. Refill(s) sent to pharmacy.  

## 2022-01-14 NOTE — Telephone Encounter (Signed)
Called and spoke with patient she states the pharmacy is being very difficult with getting the 90 day supply prescription.  She wants a new 30 day supply sent to pharmacy and for her to use only 30 day from now on.

## 2022-01-14 NOTE — Telephone Encounter (Signed)
Patient called in and would like for the prescription to stay a 30 day supply. Thank you!

## 2022-02-08 ENCOUNTER — Other Ambulatory Visit: Payer: Self-pay | Admitting: Primary Care

## 2022-02-08 DIAGNOSIS — F411 Generalized anxiety disorder: Secondary | ICD-10-CM

## 2022-02-20 ENCOUNTER — Encounter: Payer: Self-pay | Admitting: Primary Care

## 2022-02-20 ENCOUNTER — Ambulatory Visit (INDEPENDENT_AMBULATORY_CARE_PROVIDER_SITE_OTHER): Payer: 59 | Admitting: Primary Care

## 2022-02-20 VITALS — BP 110/80 | HR 76 | Temp 97.9°F | Ht 64.0 in | Wt 156.0 lb

## 2022-02-20 DIAGNOSIS — Z01818 Encounter for other preprocedural examination: Secondary | ICD-10-CM | POA: Diagnosis not present

## 2022-02-20 DIAGNOSIS — F411 Generalized anxiety disorder: Secondary | ICD-10-CM

## 2022-02-20 DIAGNOSIS — D709 Neutropenia, unspecified: Secondary | ICD-10-CM

## 2022-02-20 DIAGNOSIS — E785 Hyperlipidemia, unspecified: Secondary | ICD-10-CM

## 2022-02-20 DIAGNOSIS — G43001 Migraine without aura, not intractable, with status migrainosus: Secondary | ICD-10-CM

## 2022-02-20 DIAGNOSIS — Z0001 Encounter for general adult medical examination with abnormal findings: Secondary | ICD-10-CM

## 2022-02-20 DIAGNOSIS — Z1231 Encounter for screening mammogram for malignant neoplasm of breast: Secondary | ICD-10-CM | POA: Diagnosis not present

## 2022-02-20 LAB — CBC WITH DIFFERENTIAL/PLATELET
Basophils Absolute: 0 10*3/uL (ref 0.0–0.1)
Basophils Relative: 1 % (ref 0.0–3.0)
Eosinophils Absolute: 0.1 10*3/uL (ref 0.0–0.7)
Eosinophils Relative: 1.7 % (ref 0.0–5.0)
HCT: 40 % (ref 36.0–46.0)
Hemoglobin: 13.4 g/dL (ref 12.0–15.0)
Lymphocytes Relative: 24.3 % (ref 12.0–46.0)
Lymphs Abs: 0.9 10*3/uL (ref 0.7–4.0)
MCHC: 33.5 g/dL (ref 30.0–36.0)
MCV: 89.4 fl (ref 78.0–100.0)
Monocytes Absolute: 0.3 10*3/uL (ref 0.1–1.0)
Monocytes Relative: 7.8 % (ref 3.0–12.0)
Neutro Abs: 2.4 10*3/uL (ref 1.4–7.7)
Neutrophils Relative %: 65.2 % (ref 43.0–77.0)
Platelets: 276 10*3/uL (ref 150.0–400.0)
RBC: 4.48 Mil/uL (ref 3.87–5.11)
RDW: 13.1 % (ref 11.5–15.5)
WBC: 3.7 10*3/uL — ABNORMAL LOW (ref 4.0–10.5)

## 2022-02-20 LAB — COMPREHENSIVE METABOLIC PANEL
ALT: 13 U/L (ref 0–35)
AST: 17 U/L (ref 0–37)
Albumin: 4.3 g/dL (ref 3.5–5.2)
Alkaline Phosphatase: 70 U/L (ref 39–117)
BUN: 10 mg/dL (ref 6–23)
CO2: 31 mEq/L (ref 19–32)
Calcium: 9.3 mg/dL (ref 8.4–10.5)
Chloride: 103 mEq/L (ref 96–112)
Creatinine, Ser: 0.68 mg/dL (ref 0.40–1.20)
GFR: 95.38 mL/min (ref >60.00)
Glucose, Bld: 88 mg/dL (ref 70–99)
Potassium: 4.4 mEq/L (ref 3.5–5.1)
Sodium: 139 mEq/L (ref 135–145)
Total Bilirubin: 0.4 mg/dL (ref 0.2–1.2)
Total Protein: 6.6 g/dL (ref 6.0–8.3)

## 2022-02-20 LAB — LIPID PANEL
Cholesterol: 238 mg/dL — ABNORMAL HIGH (ref 0–200)
HDL: 98.4 mg/dL (ref >39.00)
LDL Cholesterol: 128 mg/dL — ABNORMAL HIGH (ref 0–99)
NonHDL: 139.44
Total CHOL/HDL Ratio: 2
Triglycerides: 55 mg/dL (ref 0.0–149.0)
VLDL: 11 mg/dL (ref 0.0–40.0)

## 2022-02-20 LAB — PROTIME-INR
INR: 1 ratio (ref 0.8–1.0)
Prothrombin Time: 10.9 s (ref 9.6–13.1)

## 2022-02-20 LAB — HEMOGLOBIN A1C: Hgb A1c MFr Bld: 5.5 % (ref 4.6–6.5)

## 2022-02-20 LAB — APTT: aPTT: 34.4 s (ref 25.4–36.8)

## 2022-02-20 MED ORDER — BUSPIRONE HCL 5 MG PO TABS: ORAL_TABLET | ORAL | 0 refills | Status: DC

## 2022-02-20 NOTE — Assessment & Plan Note (Signed)
Suspect benign etiology, discussed prior results with patient today. She does mention a prior history of lower WBC counts dating back years ago.   Repeat CBC with diff pending. We will continue to monitor. No alarm signs during visit.

## 2022-02-20 NOTE — Assessment & Plan Note (Signed)
Controlled.  No recent migraine. Continue Frova 2.5 mg PRN, cyclobenzaprine 5 mg PRN

## 2022-02-20 NOTE — Assessment & Plan Note (Signed)
Controlled for the most part, does have breakthrough symptoms.  Continue fluoxetine 40 mg daily. Start Buspar 5 mg 1-2 times daily as needed.  She will update.

## 2022-02-20 NOTE — Assessment & Plan Note (Signed)
Immunizations UTD. Declines Shingrix as she never had chicken pox.  Pap smear UTD. Mammogram UTD. Colonoscopy UTD, due 2031  Discussed the importance of a healthy diet and regular exercise in order for weight loss, and to reduce the risk of further co-morbidity.  Exam stable. Labs pending.  Follow up in 1 year for repeat physical.

## 2022-02-20 NOTE — Patient Instructions (Signed)
Stop by the lab prior to leaving today. I will notify you of your results once received. '  You may take the buspirone 1-2 times daily as needed for anxiety.  It was a pleasure to see you today!  Preventive Care 87-59 Years Old, Female Preventive care refers to lifestyle choices and visits with your health care provider that can promote health and wellness. Preventive care visits are also called wellness exams. What can I expect for my preventive care visit? Counseling Your health care provider may ask you questions about your: Medical history, including: Past medical problems. Family medical history. Pregnancy history. Current health, including: Menstrual cycle. Method of birth control. Emotional well-being. Home life and relationship well-being. Sexual activity and sexual health. Lifestyle, including: Alcohol, nicotine or tobacco, and drug use. Access to firearms. Diet, exercise, and sleep habits. Work and work Statistician. Sunscreen use. Safety issues such as seatbelt and bike helmet use. Physical exam Your health care provider will check your: Height and weight. These may be used to calculate your BMI (body mass index). BMI is a measurement that tells if you are at a healthy weight. Waist circumference. This measures the distance around your waistline. This measurement also tells if you are at a healthy weight and may help predict your risk of certain diseases, such as type 2 diabetes and high blood pressure. Heart rate and blood pressure. Body temperature. Skin for abnormal spots. What immunizations do I need?  Vaccines are usually given at various ages, according to a schedule. Your health care provider will recommend vaccines for you based on your age, medical history, and lifestyle or other factors, such as travel or where you work. What tests do I need? Screening Your health care provider may recommend screening tests for certain conditions. This may include: Lipid  and cholesterol levels. Diabetes screening. This is done by checking your blood sugar (glucose) after you have not eaten for a while (fasting). Pelvic exam and Pap test. Hepatitis B test. Hepatitis C test. HIV (human immunodeficiency virus) test. STI (sexually transmitted infection) testing, if you are at risk. Lung cancer screening. Colorectal cancer screening. Mammogram. Talk with your health care provider about when you should start having regular mammograms. This may depend on whether you have a family history of breast cancer. BRCA-related cancer screening. This may be done if you have a family history of breast, ovarian, tubal, or peritoneal cancers. Bone density scan. This is done to screen for osteoporosis. Talk with your health care provider about your test results, treatment options, and if necessary, the need for more tests. Follow these instructions at home: Eating and drinking  Eat a diet that includes fresh fruits and vegetables, whole grains, lean protein, and low-fat dairy products. Take vitamin and mineral supplements as recommended by your health care provider. Do not drink alcohol if: Your health care provider tells you not to drink. You are pregnant, may be pregnant, or are planning to become pregnant. If you drink alcohol: Limit how much you have to 0-1 drink a day. Know how much alcohol is in your drink. In the [place] one drink equals one 12 oz bottle of beer (355 mL), one 5 oz glass of wine (148 mL), or one 1 oz glass of hard liquor (44 mL). Lifestyle Brush your teeth every morning and night with fluoride toothpaste. Floss one time each day. Exercise for at least 30 minutes 5 or more days each week. Do not use any products that contain nicotine or tobacco. These products  include cigarettes, chewing tobacco, and vaping devices, such as e-cigarettes. If you need help quitting, ask your health care provider. Do not use drugs. If you are sexually active, practice safe  sex. Use a condom or other form of protection to prevent STIs. If you do not wish to become pregnant, use a form of birth control. If you plan to become pregnant, see your health care provider for a prepregnancy visit. Take aspirin only as told by your health care provider. Make sure that you understand how much to take and what form to take. Work with your health care provider to find out whether it is safe and beneficial for you to take aspirin daily. Find healthy ways to manage stress, such as: Meditation, yoga, or listening to music. Journaling. Talking to a trusted person. Spending time with friends and family. Minimize exposure to UV radiation to reduce your risk of skin cancer. Safety Always wear your seat belt while driving or riding in a vehicle. Do not drive: If you have been drinking alcohol. Do not ride with someone who has been drinking. When you are tired or distracted. While texting. If you have been using any mind-altering substances or drugs. Wear a helmet and other protective equipment during sports activities. If you have firearms in your house, make sure you follow all gun safety procedures. Seek help if you have been physically or sexually abused. What's next? Visit your health care provider once a year for an annual wellness visit. Ask your health care provider how often you should have your eyes and teeth checked. Stay up to date on all vaccines. This information is not intended to replace advice given to you by your health care provider. Make sure you discuss any questions you have with your health care provider. Document Revised: 08/23/2020 Document Reviewed: 08/23/2020 Elsevier Patient Education  [place].

## 2022-02-20 NOTE — Assessment & Plan Note (Signed)
Repeat lipid panel pending today.  Discussed the importance of a healthy diet and regular exercise in order for weight loss, and to reduce the risk of further co-morbidity.

## 2022-02-20 NOTE — Assessment & Plan Note (Addendum)
For pending facelift in March 2023.  Labs ordered and pending.  ECG today with NSR, rate of 65. No acute ST changes, PAC/PVC. No prior ECG to compare.

## 2022-02-20 NOTE — Progress Notes (Signed)
Subjective:    Patient ID: Karen Munoz, female    DOB: November 05, 1960, 61 y.o.   MRN: [id number]  HPI  Karen Munoz is a very pleasant 61 y.o. female who presents today for complete physical and follow up of chronic conditions. She is also needing medical clearance for an upcoming facelift.   She would like to discuss a dose increase in her fluoxetine. She continues to experience high anxiety with symptoms of feeling overwhelmed, irritability, yelling at others for no reason, worrying. She experiences these symptoms once weekly on average. She is under a lot of stress with work and her personal life.    She is also needing labs and an ECG for her upcoming facelift which is scheduled for March 2023 through [facility] in [place].   Immunizations: -Tetanus: 2018 -Influenza: Completed this season  -Shingles: Has not completed, never had chicken pox.   Diet: Fair diet.  Exercise: No regular exercise.  Eye exam: Completes annually  Dental exam: Completes semi-annually   Pap Smear: Completed in 2023 per GYN Mammogram: Completed in February 2023  Colonoscopy: Completed in 2021, due 2031  BP Readings from Last 3 Encounters:  02/20/22 110/80  02/15/21 106/62  01/17/20 100/72        Review of Systems  Constitutional:  Negative for unexpected weight change.  HENT:  Negative for rhinorrhea.   Respiratory:  Negative for cough and shortness of breath.   Cardiovascular:  Negative for chest pain.  Gastrointestinal:  Negative for constipation and diarrhea.  Genitourinary:  Negative for difficulty urinating.  Musculoskeletal:  Negative for arthralgias and myalgias.  Skin:  Negative for rash.  Allergic/Immunologic: Negative for environmental allergies.  Neurological:  Negative for dizziness, numbness and headaches.  Psychiatric/Behavioral:  The patient is nervous/anxious.          Past Medical History:  Diagnosis Date   Allergy    Anxiety    Depression    Epistaxis  02/15/2021    Social History   Socioeconomic History   Marital status: Unknown    Spouse name: Not on file   Number of children: Not on file   Years of education: Not on file   Highest education level: Not on file  Occupational History   Not on file  Tobacco Use   Smoking status: Former   Smokeless tobacco: Never  Substance and Sexual Activity   Alcohol use: Yes    Comment: occasionally on  monthly basis   Drug use: No   Sexual activity: Not on file  Other Topics Concern   Not on file  Social History Narrative   Not on file   Social Determinants of Health   Financial Resource Strain: Not on file  Food Insecurity: Not on file  Transportation Needs: Not on file  Physical Activity: Not on file  Stress: Not on file  Social Connections: Not on file  Intimate Partner Violence: Not on file    Past Surgical History:  Procedure Laterality Date   BREAST ENHANCEMENT SURGERY     breast lift     BUNIONECTOMY     x2   COLONOSCOPY  2015   mammoplas     MASTOPEXY Bilateral    4 yrs ago   POLYPECTOMY     2015- SSP x1. TA x 1    WISDOM TOOTH EXTRACTION      Family History  Problem Relation Age of Onset   Colon cancer Maternal Grandfather    Renal Disease Mother  COPD Mother    Hypertension Mother    Hyperlipidemia Mother    Hypertension Brother    Colon polyps Neg Hx    Esophageal cancer Neg Hx    Rectal cancer Neg Hx    Stomach cancer Neg Hx     Allergies  Allergen Reactions   Penicillins Rash    Current Outpatient Medications on File Prior to Visit  Medication Sig Dispense Refill   Cholecalciferol (D3 ADULT PO) Take 125 mcg by mouth daily.     Coenzyme Q10 (CO Q 10 PO) Take 30 mg by mouth daily. 15      Cyanocobalamin (B-12 PO) Take 1,000 mcg by mouth daily.     cyclobenzaprine (FLEXERIL) 5 MG tablet TAKE 1 TABLET BY MOUTH AT BEDTIME AS NEEDED FOR MIGRAINES/HEADACHES. 60 tablet 0   diclofenac Sodium (VOLTAREN) 1 % GEL Apply 2 g topically 3 (three) times  daily as needed. 100 g 0   FLUoxetine (PROZAC) 40 MG capsule Take 1 capsule (40 mg total) by mouth daily. For anxiety. 90 capsule 3   FLUoxetine (PROZAC) 40 MG capsule TAKE 1 CAPSULE BY MOUTH EVERY DAY FOR ANXIETY 30 capsule 0   frovatriptan (FROVA) 2.5 MG tablet Take 1 tablet by mouth at migraine onset. If migraine recurs, may repeat after 2 hours. 10 tablet 0   ibuprofen (ADVIL,MOTRIN) 800 MG tablet Take 800 mg by mouth.     niacin 250 MG tablet Take 250 mg by mouth at bedtime.     Omega-3 Fatty Acids (FISH OIL PO) Take 690 mg by mouth.      PSYLLIUM PO Take 1,500 mg by mouth daily.     Red Yeast Rice Extract (RED YEAST RICE PO) Take 1,200 mg by mouth daily. 1.2gms      No current facility-administered medications on file prior to visit.    BP 110/80   Pulse 76   Temp 97.9 F (36.6 C) (Temporal)   Ht '5\' 4"'$  (1.626 m)   Wt 156 lb (70.8 kg)   LMP 06/02/2013   SpO2 100%   BMI 26.78 kg/m  Objective:   Physical Exam HENT:     Right Ear: Tympanic membrane and ear canal normal.     Left Ear: Tympanic membrane and ear canal normal.     Nose: Nose normal.  Eyes:     Conjunctiva/sclera: Conjunctivae normal.     Pupils: Pupils are equal, round, and reactive to light.  Neck:     Thyroid: No thyromegaly.  Cardiovascular:     Rate and Rhythm: Normal rate and regular rhythm.     Heart sounds: No murmur heard. Pulmonary:     Effort: Pulmonary effort is normal.     Breath sounds: Normal breath sounds. No rales.  Abdominal:     General: Bowel sounds are normal.     Palpations: Abdomen is soft.     Tenderness: There is no abdominal tenderness.  Musculoskeletal:        General: Normal range of motion.     Cervical back: Neck supple.  Lymphadenopathy:     Cervical: No cervical adenopathy.  Skin:    General: Skin is warm and dry.     Findings: No rash.  Neurological:     Mental Status: She is alert and oriented to person, place, and time.     Cranial Nerves: No cranial nerve deficit.      Deep Tendon Reflexes: Reflexes are normal and symmetric.  Psychiatric:  Mood and Affect: Mood normal.     Comments: Appears anxious            Assessment & Plan:   Problem List Items Addressed This Visit       Cardiovascular and Mediastinum   Migraine with status migrainosus, not intractable    Controlled.  No recent migraine. Continue Frova 2.5 mg PRN, cyclobenzaprine 5 mg PRN        Other   GAD (generalized anxiety disorder)    Controlled for the most part, does have breakthrough symptoms.  Continue fluoxetine 40 mg daily. Start Buspar 5 mg 1-2 times daily as needed.  She will update.       Relevant Medications   busPIRone (BUSPAR) 5 MG tablet   Encounter for annual general medical examination with abnormal findings in adult - Primary    Immunizations UTD. Declines Shingrix as she never had chicken pox.  Pap smear UTD. Mammogram UTD. Colonoscopy UTD, due 2031  Discussed the importance of a healthy diet and regular exercise in order for weight loss, and to reduce the risk of further co-morbidity.  Exam stable. Labs pending.  Follow up in 1 year for repeat physical.       Hyperlipidemia    Repeat lipid panel pending today.  Discussed the importance of a healthy diet and regular exercise in order for weight loss, and to reduce the risk of further co-morbidity.       Relevant Orders   Comprehensive metabolic panel   Lipid panel   Preoperative clearance    For pending facelift in March 2023.  Labs ordered and pending.  ECG today with NSR, rate of 65. No acute ST changes, PAC/PVC. No prior ECG to compare.      Relevant Orders   EKG 12-Lead (Completed)   CBC with Differential/Platelet   Comprehensive metabolic panel   Hemoglobin A1c   Protime-INR   APTT   Factor 5 leiden   Neutropenia (HCC)    Suspect benign etiology, discussed prior results with patient today. She does mention a prior history of lower WBC counts dating back years  ago.   Repeat CBC with diff pending. We will continue to monitor. No alarm signs during visit.      Other Visit Diagnoses     Encounter for screening mammogram for malignant neoplasm of breast       Relevant Orders   MM 3D SCREEN BREAST BILATERAL          Pleas Koch, NP

## 2022-02-25 LAB — FACTOR 5 LEIDEN: Result: NEGATIVE

## 2022-03-03 ENCOUNTER — Other Ambulatory Visit: Payer: Self-pay | Admitting: Primary Care

## 2022-03-03 DIAGNOSIS — F411 Generalized anxiety disorder: Secondary | ICD-10-CM

## 2022-03-14 ENCOUNTER — Other Ambulatory Visit: Payer: Self-pay | Admitting: Primary Care

## 2022-03-14 DIAGNOSIS — F411 Generalized anxiety disorder: Secondary | ICD-10-CM

## 2022-03-18 ENCOUNTER — Other Ambulatory Visit: Payer: Self-pay | Admitting: Primary Care

## 2022-03-18 DIAGNOSIS — F411 Generalized anxiety disorder: Secondary | ICD-10-CM

## 2022-03-31 ENCOUNTER — Other Ambulatory Visit: Payer: Self-pay | Admitting: Primary Care

## 2022-03-31 DIAGNOSIS — F411 Generalized anxiety disorder: Secondary | ICD-10-CM

## 2022-04-16 ENCOUNTER — Ambulatory Visit
Admission: RE | Admit: 2022-04-16 | Discharge: 2022-04-16 | Disposition: A | Discharge status: Home / Self Care | Payer: 59 | Source: Ambulatory Visit | Attending: Primary Care | Admitting: Primary Care

## 2022-04-16 DIAGNOSIS — Z1231 Encounter for screening mammogram for malignant neoplasm of breast: Secondary | ICD-10-CM

## 2022-04-18 ENCOUNTER — Other Ambulatory Visit: Payer: Self-pay | Admitting: Primary Care

## 2022-04-18 DIAGNOSIS — R928 Other abnormal and inconclusive findings on diagnostic imaging of breast: Secondary | ICD-10-CM

## 2022-04-29 ENCOUNTER — Ambulatory Visit
Admission: RE | Admit: 2022-04-29 | Discharge: 2022-04-29 | Disposition: A | Discharge status: Home / Self Care | Payer: 59 | Source: Ambulatory Visit | Attending: Primary Care | Admitting: Primary Care

## 2022-04-29 ENCOUNTER — Ambulatory Visit: Payer: 59

## 2022-04-29 DIAGNOSIS — R928 Other abnormal and inconclusive findings on diagnostic imaging of breast: Secondary | ICD-10-CM

## 2022-06-25 ENCOUNTER — Other Ambulatory Visit: Payer: Self-pay | Admitting: Primary Care

## 2022-06-25 DIAGNOSIS — F411 Generalized anxiety disorder: Secondary | ICD-10-CM

## 2022-07-01 ENCOUNTER — Ambulatory Visit (INDEPENDENT_AMBULATORY_CARE_PROVIDER_SITE_OTHER): Payer: 59

## 2022-07-01 ENCOUNTER — Ambulatory Visit
Admission: RE | Admit: 2022-07-01 | Discharge: 2022-07-01 | Disposition: A | Discharge status: Home / Self Care | Payer: 59 | Source: Ambulatory Visit | Attending: Emergency Medicine | Admitting: Emergency Medicine

## 2022-07-01 VITALS — BP 126/82 | HR 69 | Temp 97.8°F | Resp 16

## 2022-07-01 DIAGNOSIS — M25572 Pain in left ankle and joints of left foot: Secondary | ICD-10-CM

## 2022-07-01 MED ORDER — PREDNISONE 20 MG PO TABS: 40.0000 mg | ORAL_TABLET | Freq: Every day | ORAL | 0 refills | Status: DC

## 2022-07-01 NOTE — ED Triage Notes (Signed)
Pt presents with pain at the bottom of her left foot for several weeks.

## 2022-07-01 NOTE — ED Provider Notes (Signed)
[facility]    CSN: [id number] Arrival date & time: 07/01/22  1227      History   Chief Complaint Chief Complaint  Patient presents with   Foot Injury    Entered by patient   Foot Pain    HPI Karen Munoz is a 62 y.o. female.   Patient presents for evaluation of left foot pain for at least 3 to 4 weeks.  Denies injury or trauma.  Pain is worsened when walking causing her to limp.  Denies numbness or tingling.  Has not attempted treatment.    Past Medical History:  Diagnosis Date   Allergy    Anxiety    Depression    Epistaxis 02/15/2021    Patient Active Problem List   Diagnosis Date Noted   Preoperative clearance 02/20/2022   Neutropenia 02/20/2022   Chronic knee pain 09/03/2019   Encounter for annual general medical examination with abnormal findings in adult 06/04/2016   Hyperlipidemia 06/04/2016   Rash and nonspecific skin eruption 04/22/2016   Migraine with status migrainosus, not intractable 04/22/2016   GAD (generalized anxiety disorder) 04/22/2016    Past Surgical History:  Procedure Laterality Date   breast lift     BUNIONECTOMY     x2   COLONOSCOPY  2015   mammoplas     MASTOPEXY Bilateral    4 yrs ago   POLYPECTOMY     2015- SSP x1. TA x 1    WISDOM TOOTH EXTRACTION      OB History   No obstetric history on file.      Home Medications    Prior to Admission medications   Medication Sig Start Date End Date Taking? Authorizing Provider  busPIRone (BUSPAR) 5 MG tablet TAKE 1 TABLET BY MOUTH 1 OR 2 TIMES DAILY AS NEEDED FOR ANXIETY 06/25/22   Doreene Nest, NP  Cholecalciferol (D3 ADULT PO) Take 125 mcg by mouth daily.    [provider]  Coenzyme Q10 (CO Q 10 PO) Take 30 mg by mouth daily. 15     [provider]  Cyanocobalamin (B-12 PO) Take 1,000 mcg by mouth daily.    [provider]  cyclobenzaprine (FLEXERIL) 5 MG tablet TAKE 1 TABLET BY MOUTH AT BEDTIME AS NEEDED FOR MIGRAINES/HEADACHES.  07/19/20   Doreene Nest, NP  diclofenac Sodium (VOLTAREN) 1 % GEL Apply 2 g topically 3 (three) times daily as needed. 09/03/19   Doreene Nest, NP  FLUoxetine (PROZAC) 40 MG capsule TAKE 1 CAPSULE BY MOUTH EVERY DAY FOR ANXIETY 03/03/22   Doreene Nest, NP  frovatriptan (FROVA) 2.5 MG tablet Take 1 tablet by mouth at migraine onset. If migraine recurs, may repeat after 2 hours. 02/15/21   Doreene Nest, NP  ibuprofen (ADVIL,MOTRIN) 800 MG tablet Take 800 mg by mouth. 12/27/14   [provider]  niacin 250 MG tablet Take 250 mg by mouth at bedtime.    [provider]  Omega-3 Fatty Acids (FISH OIL PO) Take 690 mg by mouth.     [provider]  PSYLLIUM PO Take 1,500 mg by mouth daily.    [provider]  Red Yeast Rice Extract (RED YEAST RICE PO) Take 1,200 mg by mouth daily. 1.2gms     [provider]    Family History Family History  Problem Relation Age of Onset   Colon cancer Maternal Grandfather    Renal Disease Mother    COPD Mother  Hypertension Mother    Hyperlipidemia Mother    Hypertension Brother    Colon polyps Neg Hx    Esophageal cancer Neg Hx    Rectal cancer Neg Hx    Stomach cancer Neg Hx     Social History Social History   Tobacco Use   Smoking status: Former   Smokeless tobacco: Never  Building services engineer Use: Never used  Substance Use Topics   Alcohol use: Yes    Comment: occasionally on  monthly basis   Drug use: No     Allergies   Penicillins   Review of Systems Review of Systems Defer to HPI   Physical Exam Triage Vital Signs ED Triage Vitals  Enc Vitals Group     BP 07/01/22 1252 126/82     Pulse Rate 07/01/22 1252 69     Resp 07/01/22 1252 16     Temp 07/01/22 1252 97.8 F (36.6 C)     Temp Source 07/01/22 1252 Oral     SpO2 07/01/22 1252 100 %     Weight --      Height --      Head Circumference --      Peak Flow --      Pain Score 07/01/22 1251 7     Pain Loc  --      Pain Edu? --      Excl. in GC? --    No data found.  Updated Vital Signs BP 126/82 (BP Location: Left Arm)   Pulse 69   Temp 97.8 F (36.6 C) (Oral)   Resp 16   LMP 06/02/2013   SpO2 100%   Visual Acuity Right Eye Distance:   Left Eye Distance:   Bilateral Distance:    Right Eye Near:   Left Eye Near:    Bilateral Near:     Physical Exam Constitutional:      Appearance: Normal appearance.  Eyes:     Extraocular Movements: Extraocular movements intact.  Pulmonary:     Effort: Pulmonary effort is normal.  Musculoskeletal:       Feet:     Comments: Point tenderness is located near the calcaneus bone without ecchymosis, swelling or deformity, has complete range of motion of the ankle, able to bear weight, 2+ dorsalis pedis pulse  Neurological:     Mental Status: She is alert and oriented to person, place, and time.      UC Treatments / Results  Labs (all labs ordered are listed, but only abnormal results are displayed) Labs Reviewed - No data to display  EKG   Radiology No results found.  Procedures Procedures (including critical care time)  Medications Ordered in UC Medications - No data to display  Initial Impression / Assessment and Plan / UC Course  I have reviewed the triage vital signs and the nursing notes.  Pertinent labs & imaging results that were available during my care of the patient were reviewed by me and considered in my medical decision making (see chart for details).  Acute left ankle pain  X-rays negative, discussed findings with patient, ankle brace applied to be used as needed, prednisone course prescribed, recommended additional support through RICE with activity as tolerated, advise follow-up with podiatry if symptoms continue to persist Final Clinical Impressions(s) / UC Diagnoses   Final diagnoses:  None   Discharge Instructions   None    ED Prescriptions   None    PDMP not reviewed this encounter.  Valinda Hoar, NP 07/01/22 1432

## 2022-07-01 NOTE — Discharge Instructions (Signed)
X-ray is negative  Begin prednisone to help reduce inflammation and pain, may take tylenol in addition  Been placed in an ankle wrap to provide stability and support, this is to be used as needed when completing activity  Elevating foot when sitting and lying will help to reduce any associated swelling  You may apply ice or heat to the affected area in 10 to 15-minute intervals for additional comfort and support   if symptoms continue to persist please follow-up with podiatry who is the specialist

## 2022-07-03 ENCOUNTER — Ambulatory Visit: Payer: 59 | Admitting: Podiatry

## 2022-07-03 ENCOUNTER — Ambulatory Visit (INDEPENDENT_AMBULATORY_CARE_PROVIDER_SITE_OTHER): Payer: 59

## 2022-07-03 DIAGNOSIS — M7672 Peroneal tendinitis, left leg: Secondary | ICD-10-CM

## 2022-07-03 DIAGNOSIS — M79672 Pain in left foot: Secondary | ICD-10-CM

## 2022-07-03 MED ORDER — MELOXICAM 15 MG PO TABS
15.0000 mg | ORAL_TABLET | Freq: Every day | ORAL | 1 refills | Status: DC
Start: 2022-07-03 — End: 2022-10-17

## 2022-07-03 MED ORDER — BETAMETHASONE SOD PHOS & ACET 6 (3-3) MG/ML IJ SUSP
3.0000 mg | Freq: Once | INTRAMUSCULAR | Status: AC
  Administered 2022-07-03: 3 mg via INTRA_ARTICULAR

## 2022-07-03 NOTE — Progress Notes (Signed)
   Chief Complaint  Patient presents with   Foot Pain    Patient came in today for left foot, lateral side of the foot, started a month ago, rate of pain 6 out of 10, patient was seen at Urgent Care, Ankle X-Rays done and given a brace medrol dosepack, X-Rays done today     HPI: 62 y.o. female presenting today for evaluation of pain and tenderness associated to the lateral aspect of the left foot has been ongoing for about 6 weeks.  She denies injuring her foot or any change in activity or shoe gear.  Idiopathic onset.  She went to the urgent care where x-rays were taken.  She was also given an ankle brace and prescribed a Medrol Dosepak but she did not take it.  She recently had facelift plastic surgery and was hesitant to take the Medrol Dosepak  Past Medical History:  Diagnosis Date   Allergy    Anxiety    Depression    Epistaxis 02/15/2021    Past Surgical History:  Procedure Laterality Date   breast lift     BUNIONECTOMY     x2   COLONOSCOPY  2015   mammoplas     MASTOPEXY Bilateral    4 yrs ago   POLYPECTOMY     2015- SSP x1. TA x 1    WISDOM TOOTH EXTRACTION      Allergies  Allergen Reactions   Penicillins Rash     Physical Exam: General: The patient is alert and oriented x3 in no acute distress.  Dermatology: Skin is warm, dry and supple bilateral lower extremities.   Vascular: Palpable pedal pulses bilaterally. Capillary refill within normal limits.  No appreciable edema.  No erythema.  Neurological: Grossly intact via light touch  Musculoskeletal Exam: No pedal deformities noted.  There is some tenderness to palpation around the fifth metatarsal tubercle at the insertion of the peroneal tendon  Radiographic Exam LT foot 07/03/2022:  Normal osseous mineralization. Joint spaces preserved.  No fractures or osseous irregularities noted.  Radiographic exam LT ankle 07/01/2022: FINDINGS: There is no evidence of fracture, dislocation, or joint effusion. There is  no evidence of arthropathy or other focal bone abnormality. Soft tissues are unremarkable.   IMPRESSION: Negative.  Assessment/Plan of Care: 1.  Insertional peroneal tendinitis left foot  -Advised against a Medrol Dosepak for the moment since she recently underwent facelift plastic surgery -Prescription for meloxicam 15 mg daily -Injection of 0.5 cc Celestone Soluspan injected around the fifth metatarsal tubercle left -Cam boot dispensed.  WBAT x 4 weeks -Return to clinic 4 weeks  *Sedentary desk job      Felecia Shelling, DPM [facility]  Dr. Felecia Shelling, DPM    [address] [place], [place] [address]                Office [phone]  Fax [phone]

## 2022-07-09 ENCOUNTER — Other Ambulatory Visit: Payer: Self-pay | Admitting: Podiatry

## 2022-07-09 DIAGNOSIS — M7672 Peroneal tendinitis, left leg: Secondary | ICD-10-CM

## 2022-07-09 DIAGNOSIS — M79672 Pain in left foot: Secondary | ICD-10-CM

## 2022-07-12 ENCOUNTER — Telehealth: Payer: 59 | Admitting: Primary Care

## 2022-07-12 ENCOUNTER — Encounter: Payer: Self-pay | Admitting: Primary Care

## 2022-07-12 VITALS — Ht 64.0 in | Wt 156.0 lb

## 2022-07-12 DIAGNOSIS — J302 Other seasonal allergic rhinitis: Secondary | ICD-10-CM

## 2022-07-12 NOTE — Assessment & Plan Note (Signed)
Symptoms and presentation today representative of allergy involvement. It's possible that she could have an early viral infection, discussed this today.   Exam difficult given virtual platform.   Discussed conservative treatment with Flonase, Tylenol, and antihistamine.   She will update next week if no improvement.

## 2022-07-12 NOTE — Progress Notes (Signed)
Patient ID: Karen Munoz, female    DOB: 1961-03-05, 62 y.o.   MRN: [id number]  Virtual visit completed through [employer], a video enabled telemedicine application. Due to national recommendations of social distancing due to COVID-19, a virtual visit is felt to be most appropriate for this patient at this time. Reviewed limitations, risks, security and privacy concerns of performing a virtual visit and the availability of in person appointments. I also reviewed that there may be a patient responsible charge related to this service. The patient agreed to proceed.   Patient location: home Provider location: [place] [facility] [place], office Persons participating in this virtual visit: patient, provider   If any vitals were documented, they were collected by patient at home unless specified below.    Ht 5\' 4"  (1.626 m) Comment: per patient  Wt 156 lb (70.8 kg) Comment: per patient  LMP 06/02/2013   BMI 26.78 kg/m    CC: Sore throat, otalgia Subjective:   HPI: Karen Munoz is a 62 y.o. female with a history of migraines, GAD, hyperlipidemia presenting on 07/12/2022 for Sore Throat (With ear ache left side x 3 day. )  Symptom onset three days ago with a mildly sore throat. She then developed increased sore throat and left ear pain. She has experienced allergy symptoms such as post nasal drip, watery eyes, sneezing. She has noticed a grey spot on one of her tonsil.   She denies fevers, headaches, sinus pressure, cough. She's taken Alka Seltzer Cold, Vicks throat spray. She completed a facelift in late March so still has some residual swelling to her neck.       Relevant past medical, surgical, family and social history reviewed and updated as indicated. Interim medical history since our last visit reviewed. Allergies and medications reviewed and updated. Outpatient Medications Prior to Visit  Medication Sig Dispense Refill   busPIRone (BUSPAR) 5 MG tablet TAKE 1 TABLET BY MOUTH 1 OR 2 TIMES  DAILY AS NEEDED FOR ANXIETY 180 tablet 0   cyclobenzaprine (FLEXERIL) 5 MG tablet TAKE 1 TABLET BY MOUTH AT BEDTIME AS NEEDED FOR MIGRAINES/HEADACHES. 60 tablet 0   FLUoxetine (PROZAC) 40 MG capsule TAKE 1 CAPSULE BY MOUTH EVERY DAY FOR ANXIETY 30 capsule 11   frovatriptan (FROVA) 2.5 MG tablet Take 1 tablet by mouth at migraine onset. If migraine recurs, may repeat after 2 hours. 10 tablet 0   meloxicam (MOBIC) 15 MG tablet Take 1 tablet (15 mg total) by mouth daily. 30 tablet 1   Cholecalciferol (D3 ADULT PO) Take 125 mcg by mouth daily. (Patient not taking: Reported on 07/12/2022)     Coenzyme Q10 (CO Q 10 PO) Take 30 mg by mouth daily. 15  (Patient not taking: Reported on 07/12/2022)     Cyanocobalamin (B-12 PO) Take 1,000 mcg by mouth daily. (Patient not taking: Reported on 07/12/2022)     niacin 250 MG tablet Take 250 mg by mouth at bedtime. (Patient not taking: Reported on 07/12/2022)     Omega-3 Fatty Acids (FISH OIL PO) Take 690 mg by mouth.  (Patient not taking: Reported on 07/12/2022)     PSYLLIUM PO Take 1,500 mg by mouth daily. (Patient not taking: Reported on 07/12/2022)     Red Yeast Rice Extract (RED YEAST RICE PO) Take 1,200 mg by mouth daily. 1.2gms  (Patient not taking: Reported on 07/12/2022)     diclofenac Sodium (VOLTAREN) 1 % GEL Apply 2 g topically 3 (three) times daily as needed. 100 g 0  predniSONE (DELTASONE) 20 MG tablet Take 2 tablets (40 mg total) by mouth daily. 10 tablet 0   No facility-administered medications prior to visit.     Per HPI unless specifically indicated in ROS section below Review of Systems  Constitutional:  Negative for chills and fever.  HENT:  Positive for ear pain, postnasal drip and sore throat. Negative for congestion.   Respiratory:  Negative for cough.   Neurological:  Negative for headaches.   Objective:  Ht 5\' 4"  (1.626 m) Comment: per patient  Wt 156 lb (70.8 kg) Comment: per patient  LMP 06/02/2013   BMI 26.78 kg/m   Wt Readings from Last  3 Encounters:  07/12/22 156 lb (70.8 kg)  02/20/22 156 lb (70.8 kg)  02/15/21 151 lb (68.5 kg)       Physical exam: General: Alert and oriented x 3, no distress, does not appear sickly  Pulmonary: Speaks in complete sentences without increased work of breathing, no cough during visit.  Psychiatric: Normal mood, thought content, and behavior.     Results for orders placed or performed in visit on 02/20/22  CBC with Differential/Platelet  Result Value Ref Range   WBC 3.7 (L) 4.0 - 10.5 K/uL   RBC 4.48 3.87 - 5.11 Mil/uL   Hemoglobin 13.4 12.0 - 15.0 g/dL   HCT 16.1 09.6 - 04.5 %   MCV 89.4 78.0 - 100.0 fl   MCHC 33.5 30.0 - 36.0 g/dL   RDW 40.9 81.1 - 91.4 %   Platelets 276.0 150.0 - 400.0 K/uL   Neutrophils Relative % 65.2 43.0 - 77.0 %   Lymphocytes Relative 24.3 12.0 - 46.0 %   Monocytes Relative 7.8 3.0 - 12.0 %   Eosinophils Relative 1.7 0.0 - 5.0 %   Basophils Relative 1.0 0.0 - 3.0 %   Neutro Abs 2.4 1.4 - 7.7 K/uL   Lymphs Abs 0.9 0.7 - 4.0 K/uL   Monocytes Absolute 0.3 0.1 - 1.0 K/uL   Eosinophils Absolute 0.1 0.0 - 0.7 K/uL   Basophils Absolute 0.0 0.0 - 0.1 K/uL  Comprehensive metabolic panel  Result Value Ref Range   Sodium 139 135 - 145 mEq/L   Potassium 4.4 3.5 - 5.1 mEq/L   Chloride 103 96 - 112 mEq/L   CO2 31 19 - 32 mEq/L   Glucose, Bld 88 70 - 99 mg/dL   BUN 10 6 - 23 mg/dL   Creatinine, Ser 7.82 0.40 - 1.20 mg/dL   Total Bilirubin 0.4 0.2 - 1.2 mg/dL   Alkaline Phosphatase 70 39 - 117 U/L   AST 17 0 - 37 U/L   ALT 13 0 - 35 U/L   Total Protein 6.6 6.0 - 8.3 g/dL   Albumin 4.3 3.5 - 5.2 g/dL   GFR 95.62 >13.08 mL/min   Calcium 9.3 8.4 - 10.5 mg/dL  Hemoglobin M5H  Result Value Ref Range   Hgb A1c MFr Bld 5.5 4.6 - 6.5 %  Lipid panel  Result Value Ref Range   Cholesterol 238 (H) 0 - 200 mg/dL   Triglycerides 84.6 0.0 - 149.0 mg/dL   HDL 96.29 >52.84 mg/dL   VLDL 13.2 0.0 - 44.0 mg/dL   LDL Cholesterol 102 (H) 0 - 99 mg/dL   Total CHOL/HDL  Ratio 2    NonHDL 139.44   Protime-INR  Result Value Ref Range   INR 1.0 0.8 - 1.0 ratio   Prothrombin Time 10.9 9.6 - 13.1 sec  APTT  Result Value Ref Range  aPTT 34.4 25.4 - 36.8 SEC  Factor 5 leiden  Result Value Ref Range   Result NEGATIVE    INTERPRETATION See Below    Assessment & Plan:   Problem List Items Addressed This Visit       Other   Seasonal allergies - Primary    Symptoms and presentation today representative of allergy involvement. It's possible that she could have an early viral infection, discussed this today.   Exam difficult given virtual platform.   Discussed conservative treatment with Flonase, Tylenol, and antihistamine.   She will update next week if no improvement.        No orders of the defined types were placed in this encounter.  No orders of the defined types were placed in this encounter.   I discussed the assessment and treatment plan with the patient. The patient was provided an opportunity to ask questions and all were answered. The patient agreed with the plan and demonstrated an understanding of the instructions. The patient was advised to call back or seek an in-person evaluation if the symptoms worsen or if the condition fails to improve as anticipated.  Follow up plan:  Nasal Congestion/Ear Pressure/Sinus Pressure: Try using Flonase (fluticasone) nasal spray. Instill 1 spray in each nostril twice daily.   Start Tylenol during the day. Continue Meloxicam as prescribed.  Consider an antihistamine such as Claritin, Allegra, Zyrtec for drainage and allergies.   It was a pleasure to see you today!   Doreene Nest, NP

## 2022-07-12 NOTE — Patient Instructions (Signed)
Nasal Congestion/Ear Pressure/Sinus Pressure: Try using Flonase (fluticasone) nasal spray. Instill 1 spray in each nostril twice daily.   Start Tylenol during the day. Continue Meloxicam as prescribed.  Consider an antihistamine such as Claritin, Allegra, Zyrtec for drainage and allergies.   It was a pleasure to see you today!

## 2022-07-18 ENCOUNTER — Ambulatory Visit: Payer: 59 | Admitting: Primary Care

## 2022-07-18 ENCOUNTER — Encounter: Payer: Self-pay | Admitting: Primary Care

## 2022-07-18 VITALS — BP 116/72 | HR 80 | Temp 97.9°F | Ht 64.0 in | Wt 151.0 lb

## 2022-07-18 DIAGNOSIS — J302 Other seasonal allergic rhinitis: Secondary | ICD-10-CM | POA: Diagnosis not present

## 2022-07-18 DIAGNOSIS — J029 Acute pharyngitis, unspecified: Secondary | ICD-10-CM

## 2022-07-18 LAB — POCT RAPID STREP A (OFFICE): Rapid Strep A Screen: NEGATIVE

## 2022-07-18 MED ORDER — FLUTICASONE PROPIONATE 50 MCG/ACT NA SUSP
1.0000 | Freq: Two times a day (BID) | NASAL | 0 refills | Status: DC
Start: 2022-07-18 — End: 2022-08-11

## 2022-07-18 MED ORDER — CETIRIZINE HCL 10 MG PO TABS
10.0000 mg | ORAL_TABLET | Freq: Every day | ORAL | 0 refills | Status: DC
Start: 2022-07-18 — End: 2022-10-09

## 2022-07-18 NOTE — Patient Instructions (Addendum)
Start Zyrtec 10 mg every night for throat drainage, sore throat.  Nasal Congestion/Ear Pressure/Sinus Pressure: Try using Flonase (fluticasone) nasal spray. Instill 1 spray in each nostril twice daily.   Stop the cephalexin antibiotic.  It was a pleasure to see you today!

## 2022-07-18 NOTE — Assessment & Plan Note (Signed)
Symptoms today representative of allergy cause.  Exam today grossly benign.   Rapid strep A negative today.  Discussed to stop cephalexin.  Start antihistamine. Switch to Flonase.  She will update if this regimen does not help.

## 2022-07-18 NOTE — Progress Notes (Signed)
Subjective:    Patient ID: Karen Munoz, female    DOB: [birth year], 62 y.o.   MRN: [id number]  Sore Throat  Associated symptoms include ear pain. Pertinent negatives include no coughing or shortness of breath.    Karen Munoz is a very pleasant 62 y.o. female with a history of seasonal allergies, neutropenia who presents today to discuss sore throat.   Symptom onset 9 days ago with sore throat. She then developed left ear pain, post nasal drip, waking in the morning feeling sweaty, feeling feverish (has not checked temperature),   She tested negative for Covid-19 infection last weekend. Today she's feeling mildly better.   She began taking cephalexin 500 mg three times daily, three days ago. She received this prescription a few months ago from her surgeon as prophylaxis. Today her throat is mildly better. She's also been taking Alka Seltzer Cold Day and Night, Saline nasal spray, and warm salt gargles.   Review of Systems  Constitutional:  Negative for chills and fever.  HENT:  Positive for ear pain, postnasal drip and sore throat.   Respiratory:  Negative for cough, shortness of breath and wheezing.          Past Medical History:  Diagnosis Date   Allergy    Anxiety    Depression    Epistaxis 02/15/2021    Social History   Socioeconomic History   Marital status: Unknown    Spouse name: Not on file   Number of children: Not on file   Years of education: Not on file   Highest education level: Not on file  Occupational History   Not on file  Tobacco Use   Smoking status: Former   Smokeless tobacco: Never  Vaping Use   Vaping Use: Never used  Substance and Sexual Activity   Alcohol use: Yes    Comment: occasionally on  monthly basis   Drug use: No   Sexual activity: Not on file  Other Topics Concern   Not on file  Social History Narrative   Not on file   Social Determinants of Health   Financial Resource Strain: Not on file  Food Insecurity: Not on file   Transportation Needs: Not on file  Physical Activity: Not on file  Stress: Not on file  Social Connections: Not on file  Intimate Partner Violence: Not on file    Past Surgical History:  Procedure Laterality Date   breast lift     BUNIONECTOMY     x2   COLONOSCOPY  2015   mammoplas     MASTOPEXY Bilateral    4 yrs ago   POLYPECTOMY     2015- SSP x1. TA x 1    WISDOM TOOTH EXTRACTION      Family History  Problem Relation Age of Onset   Colon cancer Maternal Grandfather    Renal Disease Mother    COPD Mother    Hypertension Mother    Hyperlipidemia Mother    Hypertension Brother    Colon polyps Neg Hx    Esophageal cancer Neg Hx    Rectal cancer Neg Hx    Stomach cancer Neg Hx     Allergies  Allergen Reactions   Penicillins Rash    Current Outpatient Medications on File Prior to Visit  Medication Sig Dispense Refill   busPIRone (BUSPAR) 5 MG tablet TAKE 1 TABLET BY MOUTH 1 OR 2 TIMES DAILY AS NEEDED FOR ANXIETY 180 tablet 0   FLUoxetine (PROZAC) 40 MG capsule  TAKE 1 CAPSULE BY MOUTH EVERY DAY FOR ANXIETY 30 capsule 11   frovatriptan (FROVA) 2.5 MG tablet Take 1 tablet by mouth at migraine onset. If migraine recurs, may repeat after 2 hours. 10 tablet 0   meloxicam (MOBIC) 15 MG tablet Take 1 tablet (15 mg total) by mouth daily. 30 tablet 1   Cholecalciferol (D3 ADULT PO) Take 125 mcg by mouth daily. (Patient not taking: Reported on 07/12/2022)     Coenzyme Q10 (CO Q 10 PO) Take 30 mg by mouth daily. 15  (Patient not taking: Reported on 07/12/2022)     Cyanocobalamin (B-12 PO) Take 1,000 mcg by mouth daily. (Patient not taking: Reported on 07/12/2022)     cyclobenzaprine (FLEXERIL) 5 MG tablet TAKE 1 TABLET BY MOUTH AT BEDTIME AS NEEDED FOR MIGRAINES/HEADACHES. (Patient not taking: Reported on 07/18/2022) 60 tablet 0   niacin 250 MG tablet Take 250 mg by mouth at bedtime. (Patient not taking: Reported on 07/18/2022)     Omega-3 Fatty Acids (FISH OIL PO) Take 690 mg by mouth.   (Patient not taking: Reported on 07/12/2022)     PSYLLIUM PO Take 1,500 mg by mouth daily. (Patient not taking: Reported on 07/12/2022)     Red Yeast Rice Extract (RED YEAST RICE PO) Take 1,200 mg by mouth daily. 1.2gms  (Patient not taking: Reported on 07/12/2022)     No current facility-administered medications on file prior to visit.    BP 116/72   Pulse 80   Temp 97.9 F (36.6 C) (Temporal)   Ht 5\' 4"  (1.626 m)   Wt 151 lb (68.5 kg)   LMP 06/02/2013   SpO2 98%   BMI 25.92 kg/m  Objective:   Physical Exam HENT:     Right Ear: Ear canal normal. Tympanic membrane is bulging. Tympanic membrane is not erythematous.     Left Ear: Ear canal normal. Tympanic membrane is bulging. Tympanic membrane is not erythematous.     Nose:     Right Sinus: No maxillary sinus tenderness or frontal sinus tenderness.     Left Sinus: No maxillary sinus tenderness or frontal sinus tenderness.     Mouth/Throat:     Pharynx: No posterior oropharyngeal erythema.  Eyes:     Conjunctiva/sclera: Conjunctivae normal.  Cardiovascular:     Rate and Rhythm: Normal rate and regular rhythm.  Pulmonary:     Effort: Pulmonary effort is normal.     Breath sounds: Normal breath sounds. No wheezing or rales.  Musculoskeletal:     Cervical back: Neck supple.  Lymphadenopathy:     Cervical: No cervical adenopathy.  Skin:    General: Skin is warm and dry.           Assessment & Plan:  Sore throat -     POCT rapid strep A  Seasonal allergies Assessment & Plan: Symptoms today representative of allergy cause.  Exam today grossly benign.   Rapid strep A negative today.  Discussed to stop cephalexin.  Start antihistamine. Switch to Flonase.  She will update if this regimen does not help.  Orders: -     Cetirizine HCl; Take 1 tablet (10 mg total) by mouth daily. For allergies.  Dispense: 90 tablet; Refill: 0 -     Fluticasone Propionate; Place 1 spray into both nostrils 2 (two) times daily.  Dispense: 16  g; Refill: 0        Doreene Nest, NP

## 2022-07-31 ENCOUNTER — Ambulatory Visit: Payer: 59 | Admitting: Podiatry

## 2022-08-10 ENCOUNTER — Other Ambulatory Visit: Payer: Self-pay | Admitting: Primary Care

## 2022-08-10 DIAGNOSIS — J302 Other seasonal allergic rhinitis: Secondary | ICD-10-CM

## 2022-08-26 ENCOUNTER — Ambulatory Visit: Payer: 59 | Admitting: Podiatry

## 2022-09-09 ENCOUNTER — Other Ambulatory Visit: Payer: Self-pay | Admitting: Primary Care

## 2022-09-09 DIAGNOSIS — F411 Generalized anxiety disorder: Secondary | ICD-10-CM

## 2022-10-09 ENCOUNTER — Other Ambulatory Visit: Payer: Self-pay | Admitting: Primary Care

## 2022-10-09 DIAGNOSIS — J302 Other seasonal allergic rhinitis: Secondary | ICD-10-CM

## 2022-10-17 ENCOUNTER — Other Ambulatory Visit: Payer: Self-pay | Admitting: Podiatry

## 2022-11-15 ENCOUNTER — Other Ambulatory Visit: Payer: Self-pay | Admitting: Primary Care

## 2022-11-15 DIAGNOSIS — J302 Other seasonal allergic rhinitis: Secondary | ICD-10-CM

## 2022-11-15 NOTE — Telephone Encounter (Signed)
Lvmtcb, sent mychart message  

## 2022-11-15 NOTE — Telephone Encounter (Signed)
Patient is due for CPE/follow up in late December, this will be required prior to any further refills.  Please schedule, thank you!

## 2022-11-30 ENCOUNTER — Other Ambulatory Visit: Payer: Self-pay | Admitting: Primary Care

## 2022-11-30 DIAGNOSIS — F411 Generalized anxiety disorder: Secondary | ICD-10-CM

## 2023-01-11 ENCOUNTER — Other Ambulatory Visit: Payer: Self-pay | Admitting: Podiatry

## 2023-02-07 ENCOUNTER — Other Ambulatory Visit: Payer: Self-pay | Admitting: Primary Care

## 2023-02-07 DIAGNOSIS — F411 Generalized anxiety disorder: Secondary | ICD-10-CM

## 2023-02-28 ENCOUNTER — Other Ambulatory Visit: Payer: Self-pay | Admitting: Primary Care

## 2023-02-28 DIAGNOSIS — F411 Generalized anxiety disorder: Secondary | ICD-10-CM

## 2023-03-04 ENCOUNTER — Ambulatory Visit (INDEPENDENT_AMBULATORY_CARE_PROVIDER_SITE_OTHER): Payer: 59 | Admitting: Primary Care

## 2023-03-04 ENCOUNTER — Encounter: Payer: Self-pay | Admitting: Primary Care

## 2023-03-04 VITALS — BP 122/86 | HR 76 | Temp 98.0°F | Ht 64.0 in | Wt 161.4 lb

## 2023-03-04 DIAGNOSIS — Z1231 Encounter for screening mammogram for malignant neoplasm of breast: Secondary | ICD-10-CM

## 2023-03-04 DIAGNOSIS — E785 Hyperlipidemia, unspecified: Secondary | ICD-10-CM | POA: Diagnosis not present

## 2023-03-04 DIAGNOSIS — Z0001 Encounter for general adult medical examination with abnormal findings: Secondary | ICD-10-CM

## 2023-03-04 DIAGNOSIS — G43001 Migraine without aura, not intractable, with status migrainosus: Secondary | ICD-10-CM

## 2023-03-04 DIAGNOSIS — D709 Neutropenia, unspecified: Secondary | ICD-10-CM

## 2023-03-04 DIAGNOSIS — J019 Acute sinusitis, unspecified: Secondary | ICD-10-CM | POA: Diagnosis not present

## 2023-03-04 DIAGNOSIS — Z23 Encounter for immunization: Secondary | ICD-10-CM

## 2023-03-04 DIAGNOSIS — F411 Generalized anxiety disorder: Secondary | ICD-10-CM

## 2023-03-04 HISTORY — DX: Acute sinusitis, unspecified: J01.90

## 2023-03-04 LAB — CBC WITH DIFFERENTIAL/PLATELET
Basophils Absolute: 0 10*3/uL (ref 0.0–0.1)
Basophils Relative: 1 % (ref 0.0–3.0)
Eosinophils Absolute: 0.1 10*3/uL (ref 0.0–0.7)
Eosinophils Relative: 2.4 % (ref 0.0–5.0)
HCT: 40.8 % (ref 36.0–46.0)
Hemoglobin: 13.5 g/dL (ref 12.0–15.0)
Lymphocytes Relative: 29.3 % (ref 12.0–46.0)
Lymphs Abs: 1.3 10*3/uL (ref 0.7–4.0)
MCHC: 33.2 g/dL (ref 30.0–36.0)
MCV: 90.3 fL (ref 78.0–100.0)
Monocytes Absolute: 0.4 10*3/uL (ref 0.1–1.0)
Monocytes Relative: 8.8 % (ref 3.0–12.0)
Neutro Abs: 2.5 10*3/uL (ref 1.4–7.7)
Neutrophils Relative %: 58.5 % (ref 43.0–77.0)
Platelets: 287 10*3/uL (ref 150.0–400.0)
RBC: 4.52 Mil/uL (ref 3.87–5.11)
RDW: 13.3 % (ref 11.5–15.5)
WBC: 4.3 10*3/uL (ref 4.0–10.5)

## 2023-03-04 LAB — COMPREHENSIVE METABOLIC PANEL
ALT: 16 U/L (ref 0–35)
AST: 18 U/L (ref 0–37)
Albumin: 4.1 g/dL (ref 3.5–5.2)
Alkaline Phosphatase: 82 U/L (ref 39–117)
BUN: 14 mg/dL (ref 6–23)
CO2: 30 meq/L (ref 19–32)
Calcium: 9.1 mg/dL (ref 8.4–10.5)
Chloride: 104 meq/L (ref 96–112)
Creatinine, Ser: 0.77 mg/dL (ref 0.40–1.20)
GFR: 83.87 mL/min (ref >60.00)
Glucose, Bld: 92 mg/dL (ref 70–99)
Potassium: 3.9 meq/L (ref 3.5–5.1)
Sodium: 140 meq/L (ref 135–145)
Total Bilirubin: 0.4 mg/dL (ref 0.2–1.2)
Total Protein: 6.3 g/dL (ref 6.0–8.3)

## 2023-03-04 LAB — HEMOGLOBIN A1C: Hgb A1c MFr Bld: 5.6 % (ref 4.6–6.5)

## 2023-03-04 LAB — LIPID PANEL
Cholesterol: 245 mg/dL — ABNORMAL HIGH (ref 0–200)
HDL: 75.6 mg/dL (ref >39.00)
LDL Cholesterol: 155 mg/dL — ABNORMAL HIGH (ref 0–99)
NonHDL: 169.52
Total CHOL/HDL Ratio: 3
Triglycerides: 72 mg/dL (ref 0.0–149.0)
VLDL: 14.4 mg/dL (ref 0.0–40.0)

## 2023-03-04 MED ORDER — PREDNISONE 20 MG PO TABS: ORAL_TABLET | ORAL | 0 refills | Status: DC

## 2023-03-04 NOTE — Assessment & Plan Note (Signed)
Immunizations UTD. Influenza vaccine provided today.  Pap smear UTD. Mammogram due in February 2025, orders placed. Colonoscopy UTD, due 2031  Discussed the importance of a healthy diet and regular exercise in order for weight loss, and to reduce the risk of further co-morbidity.  Exam stable. Labs pending.  Follow up in 1 year for repeat physical.

## 2023-03-04 NOTE — Patient Instructions (Signed)
Stop by the lab prior to leaving today. I will notify you of your results once received.   Start prednisone 20 mg tablets. Take 2 tablets by mouth once daily in the morning for 5 days.  [facility].   It was a pleasure to see you today!

## 2023-03-04 NOTE — Assessment & Plan Note (Signed)
Improved, do suspect lingering symptoms are from recent infection. Reviewed urgent care notes from care everywhere from December 2024.  Start prednisone 20 mg tablets. Take 2 tablets by mouth once daily in the morning for 5 days.  She will update.

## 2023-03-04 NOTE — Progress Notes (Signed)
Subjective:    Patient ID: Karen Munoz, female    DOB: June 09, 1960, 63 y.o.   MRN: [id number]  HPI  Karen Munoz is a very pleasant 62 y.o. female who presents today for complete physical and follow up of chronic conditions.  She would also like to discuss URI symptoms. Symptoms include head pressure, sinus pressure, chest congestion, cough. Symptoms began about 2 weeks ago. Evaluated at urgent care on 02/26/23, treated for sinusitis with azithromycin course due to PCN allergy. Overall she's improved ,but she continues with head congestion, sinus pressure, cough. She's been using Flonase which hasn't helped much.   Immunizations: -Tetanus: Completed in 2018 -Influenza: Influenza vaccine provided today.  -Shingles: Never completed, never had chicken pox  Diet: Fair diet.  Exercise: No regular exercise.  Eye exam: Completes annually  Dental exam: Completes semi-annually    Pap Smear: Completed in 2022 Mammogram: Completed in February 2024  Colonoscopy: Completed in 2021, due 2031  BP Readings from Last 3 Encounters:  03/04/23 122/86  07/18/22 116/72  07/01/22 126/82   Wt Readings from Last 3 Encounters:  03/04/23 161 lb 6.4 oz (73.2 kg)  07/18/22 151 lb (68.5 kg)  07/12/22 156 lb (70.8 kg)       Review of Systems  Constitutional:  Negative for unexpected weight change.  HENT:  Positive for congestion and sinus pressure.   Respiratory:  Positive for cough. Negative for shortness of breath.   Cardiovascular:  Negative for chest pain.  Gastrointestinal:  Negative for constipation and diarrhea.  Genitourinary:  Negative for difficulty urinating.  Musculoskeletal:  Negative for arthralgias.  Skin:  Negative for rash.  Allergic/Immunologic: Negative for environmental allergies.  Neurological:  Negative for dizziness and headaches.  Psychiatric/Behavioral:  Positive for sleep disturbance. The patient is not nervous/anxious.          Past Medical History:  Diagnosis  Date   Allergy    Anxiety    Depression    Epistaxis 02/15/2021    Social History   Socioeconomic History   Marital status: Unknown    Spouse name: Not on file   Number of children: Not on file   Years of education: Not on file   Highest education level: Bachelor's degree (e.g., BA, AB, BS)  Occupational History   Not on file  Tobacco Use   Smoking status: Former   Smokeless tobacco: Never  Vaping Use   Vaping status: Never Used  Substance and Sexual Activity   Alcohol use: Yes    Comment: occasionally on  monthly basis   Drug use: No   Sexual activity: Not on file  Other Topics Concern   Not on file  Social History Narrative   Not on file   Social Drivers of Health   Financial Resource Strain: Low Risk  (03/03/2023)   Overall Financial Resource Strain (CARDIA)    Difficulty of Paying Living Expenses: Not hard at all  Food Insecurity: No Food Insecurity (03/03/2023)   Hunger Vital Sign    Worried About Running Out of Food in the Last Year: Never true    Ran Out of Food in the Last Year: Never true  Transportation Needs: No Transportation Needs (03/03/2023)   PRAPARE - Administrator, Civil Service (Medical): No    Lack of Transportation (Non-Medical): No  Physical Activity: Insufficiently Active (03/03/2023)   Exercise Vital Sign    Days of Exercise per Week: 2 days    Minutes of Exercise per Session: 30 min  Stress: Stress Concern Present (03/03/2023)   [employer] of Occupational Health - Occupational Stress Questionnaire    Feeling of Stress : Very much  Social Connections: Socially Isolated (03/03/2023)   Social Connection and Isolation Panel [NHANES]    Frequency of Communication with Friends and Family: More than three times a week    Frequency of Social Gatherings with Friends and Family: Once a week    Attends Religious Services: Never    Database administrator or Organizations: No    Attends Engineer, structural: Not on file     Marital Status: Widowed  Intimate Partner Violence: Not on file    Past Surgical History:  Procedure Laterality Date   breast lift     BUNIONECTOMY     x2   COLONOSCOPY  2015   mammoplas     MASTOPEXY Bilateral    4 yrs ago   POLYPECTOMY     2015- SSP x1. TA x 1    WISDOM TOOTH EXTRACTION      Family History  Problem Relation Age of Onset   Colon cancer Maternal Grandfather    Renal Disease Mother    COPD Mother    Hypertension Mother    Hyperlipidemia Mother    Hypertension Brother    Colon polyps Neg Hx    Esophageal cancer Neg Hx    Rectal cancer Neg Hx    Stomach cancer Neg Hx     Allergies  Allergen Reactions   Penicillins Rash    Current Outpatient Medications on File Prior to Visit  Medication Sig Dispense Refill   busPIRone (BUSPAR) 5 MG tablet TAKE 1 TABLET BY MOUTH 1 OR 2 TIMES DAILY AS NEEDED FOR ANXIETY 180 tablet 0   cetirizine (ZYRTEC) 10 MG tablet TAKE 1 TABLET (10 MG TOTAL) BY MOUTH DAILY. FOR ALLERGIES. 90 tablet 1   Cholecalciferol (D3 ADULT PO) Take 125 mcg by mouth daily.     Coenzyme Q10 (CO Q 10 PO) Take 30 mg by mouth daily. 15     Cyanocobalamin (B-12 PO) Take 1,000 mcg by mouth daily.     cyclobenzaprine (FLEXERIL) 5 MG tablet TAKE 1 TABLET BY MOUTH AT BEDTIME AS NEEDED FOR MIGRAINES/HEADACHES. 60 tablet 0   FLUoxetine (PROZAC) 40 MG capsule TAKE 1 CAPSULE BY MOUTH EVERY DAY FOR ANXIETY 90 capsule 0   fluticasone (FLONASE) 50 MCG/ACT nasal spray PLACE 1 SPRAY INTO BOTH NOSTRILS 2 (TWO) TIMES DAILY 48 mL 0   frovatriptan (FROVA) 2.5 MG tablet Take 1 tablet by mouth at migraine onset. If migraine recurs, may repeat after 2 hours. 10 tablet 0   niacin 250 MG tablet Take 250 mg by mouth at bedtime.     Omega-3 Fatty Acids (FISH OIL PO) Take 690 mg by mouth.     PSYLLIUM PO Take 1,500 mg by mouth daily.     Red Yeast Rice Extract (RED YEAST RICE PO) Take 1,200 mg by mouth daily. 1.2gms     meloxicam (MOBIC) 15 MG tablet TAKE 1 TABLET (15 MG  TOTAL) BY MOUTH DAILY. (Patient not taking: Reported on 03/04/2023) 30 tablet 1   No current facility-administered medications on file prior to visit.    BP 122/86 (BP Location: Left Arm, Patient Position: Sitting, Cuff Size: Normal)   Pulse 76   Temp 98 F (36.7 C) (Temporal)   Ht 5\' 4"  (1.626 m)   Wt 161 lb 6.4 oz (73.2 kg)   LMP 06/02/2013   SpO2 99%  BMI 27.70 kg/m  Objective:   Physical Exam HENT:     Right Ear: Tympanic membrane and ear canal normal.     Left Ear: Tympanic membrane and ear canal normal.  Eyes:     Pupils: Pupils are equal, round, and reactive to light.  Cardiovascular:     Rate and Rhythm: Normal rate and regular rhythm.  Pulmonary:     Effort: Pulmonary effort is normal.     Breath sounds: Normal breath sounds.  Abdominal:     General: Bowel sounds are normal.     Palpations: Abdomen is soft.     Tenderness: There is no abdominal tenderness.  Musculoskeletal:        General: Normal range of motion.     Cervical back: Neck supple.  Skin:    General: Skin is warm and dry.  Neurological:     Mental Status: She is alert and oriented to person, place, and time.     Cranial Nerves: No cranial nerve deficit.     Deep Tendon Reflexes:     Reflex Scores:      Patellar reflexes are 2+ on the right side and 2+ on the left side. Psychiatric:        Mood and Affect: Mood normal.           Assessment & Plan:  Encounter for annual general medical examination with abnormal findings in adult Assessment & Plan: Immunizations UTD. Influenza vaccine provided today.  Pap smear UTD. Mammogram due in February 2025, orders placed. Colonoscopy UTD, due 2031  Discussed the importance of a healthy diet and regular exercise in order for weight loss, and to reduce the risk of further co-morbidity.  Exam stable. Labs pending.  Follow up in 1 year for repeat physical.   Orders: -     Flu vaccine trivalent PF, 6mos and  older(Flulaval,Afluria,Fluarix,Fluzone)  Migraine without aura and with status migrainosus, not intractable Assessment & Plan: Controlled.  No concerns today. Continue Frova 2.5 mg PRN and cyclobenzaprine 5 mg PRN.    Hyperlipidemia, unspecified hyperlipidemia type Assessment & Plan: Repeat panel pending.  Encouraged regular exercise and healthy diet.  Orders: -     Lipid panel -     Comprehensive metabolic panel  Neutropenia, unspecified type [facility]) Assessment & Plan: Reviewed prior labs, appears to be benign.  Repeat CBC with differential pending.  Orders: -     Hemoglobin A1c -     CBC with Differential/Platelet  Acute non-recurrent sinusitis, unspecified location Assessment & Plan: Improved, do suspect lingering symptoms are from recent infection. Reviewed urgent care notes from care everywhere from December 2024.  Start prednisone 20 mg tablets. Take 2 tablets by mouth once daily in the morning for 5 days.  She will update.  Orders: -     predniSONE; Take 2 tablets by mouth once daily for 5 days.  Dispense: 10 tablet; Refill: 0  Screening mammogram for breast cancer -     3D Screening Mammogram, Left and Right; Future  GAD (generalized anxiety disorder) Assessment & Plan: Slightly deteriorated given the recent loss of her husband.  Sleep disturbance at night seems to be secondary to anxiety.  Continue fluoxetine 40 mg daily, increase buspirone 5 mg twice daily.  She will update.         Doreene Nest, NP

## 2023-03-04 NOTE — Assessment & Plan Note (Signed)
Slightly deteriorated given the recent loss of her husband.  Sleep disturbance at night seems to be secondary to anxiety.  Continue fluoxetine 40 mg daily, increase buspirone 5 mg twice daily.  She will update.

## 2023-03-04 NOTE — Assessment & Plan Note (Signed)
Reviewed prior labs, appears to be benign.  Repeat CBC with differential pending.

## 2023-03-04 NOTE — Assessment & Plan Note (Signed)
Repeat panel pending.  Encouraged regular exercise and healthy diet.

## 2023-03-04 NOTE — Assessment & Plan Note (Signed)
Controlled.  No concerns today. Continue Frova 2.5 mg PRN and cyclobenzaprine 5 mg PRN.

## 2023-03-19 ENCOUNTER — Other Ambulatory Visit: Payer: Self-pay | Admitting: Podiatry

## 2023-05-08 ENCOUNTER — Other Ambulatory Visit: Payer: Self-pay | Admitting: Primary Care

## 2023-05-08 DIAGNOSIS — F411 Generalized anxiety disorder: Secondary | ICD-10-CM

## 2023-05-16 ENCOUNTER — Ambulatory Visit
Admission: RE | Admit: 2023-05-16 | Discharge: 2023-05-16 | Disposition: A | Discharge status: Home / Self Care | Payer: 59 | Source: Ambulatory Visit | Attending: Primary Care | Admitting: Primary Care

## 2023-05-16 DIAGNOSIS — Z1231 Encounter for screening mammogram for malignant neoplasm of breast: Secondary | ICD-10-CM

## 2023-05-30 ENCOUNTER — Other Ambulatory Visit: Payer: Self-pay | Admitting: Primary Care

## 2023-05-30 DIAGNOSIS — F411 Generalized anxiety disorder: Secondary | ICD-10-CM

## 2023-09-11 ENCOUNTER — Other Ambulatory Visit: Payer: Self-pay | Admitting: Primary Care

## 2023-09-11 DIAGNOSIS — J302 Other seasonal allergic rhinitis: Secondary | ICD-10-CM

## 2023-11-24 ENCOUNTER — Other Ambulatory Visit: Payer: Self-pay | Admitting: Primary Care

## 2023-11-24 DIAGNOSIS — F411 Generalized anxiety disorder: Secondary | ICD-10-CM

## 2023-11-24 NOTE — Telephone Encounter (Signed)
 Patient is due for CPE/follow up late December, this will be required prior to any further refills.  Please schedule, thank you!

## 2023-12-03 NOTE — Telephone Encounter (Signed)
 Lvm/sent Mychart

## 2023-12-04 NOTE — Telephone Encounter (Signed)
 Lvm to schedule for CPE in Late Dec

## 2023-12-11 IMAGING — MG MM DIGITAL SCREENING BILAT W/ TOMO AND CAD
8 series · 8 of 24 positions shown · non-contrast
Comparison: Previous exam(s).

CLINICAL DATA: Screening.

EXAM:
DIGITAL SCREENING BILATERAL MAMMOGRAM WITH TOMOSYNTHESIS AND CAD
TECHNIQUE: Bilateral screening digital craniocaudal and mediolateral oblique
mammograms were obtained. Bilateral screening digital breast
tomosynthesis was performed. The images were evaluated with
computer-aided detection.

[R MLO synth-2D]
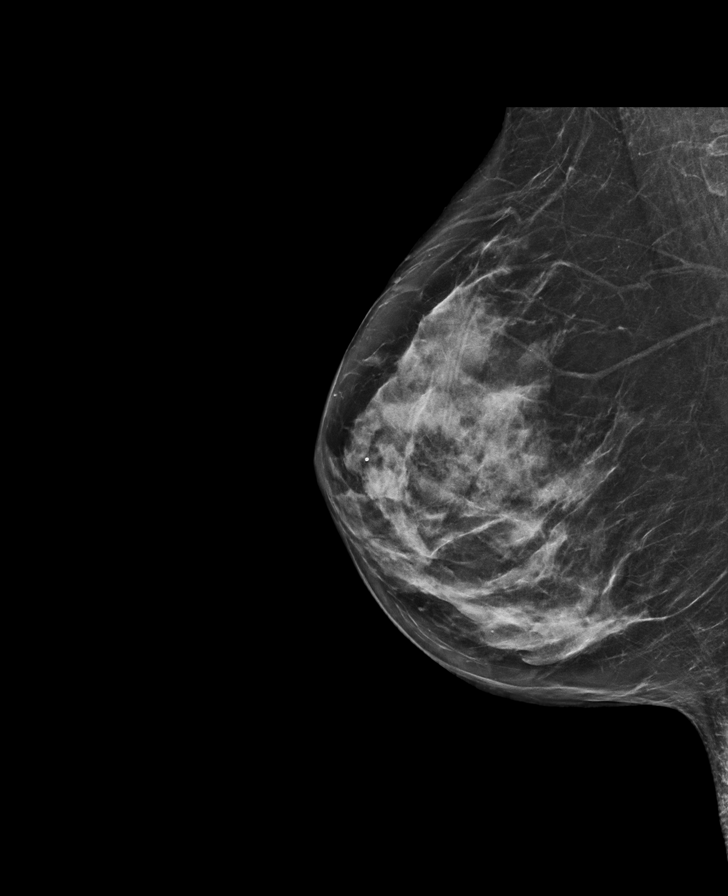

[L CC synth-2D]
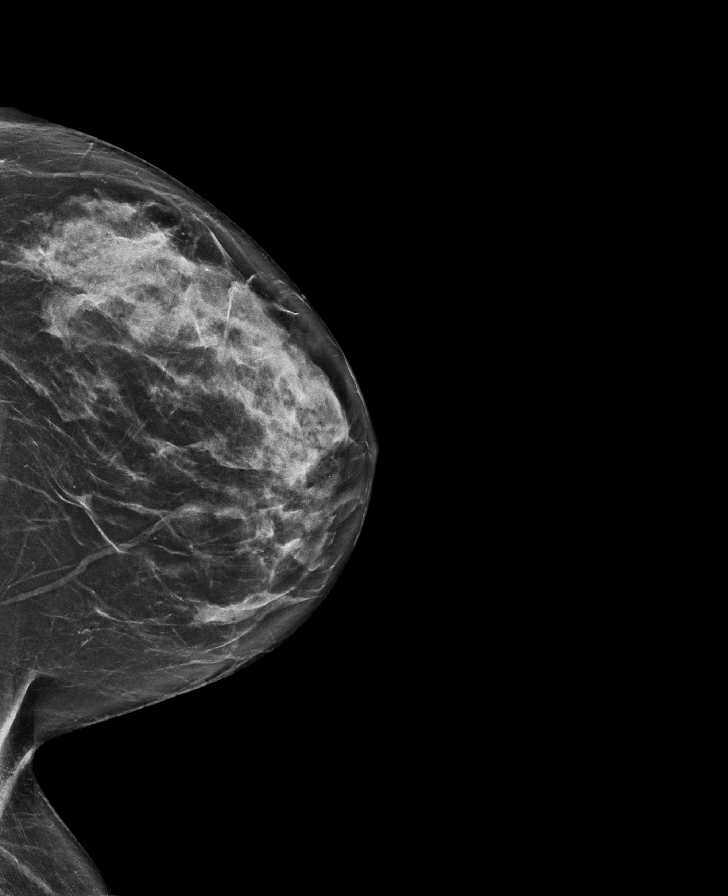

[R CC synth-2D]
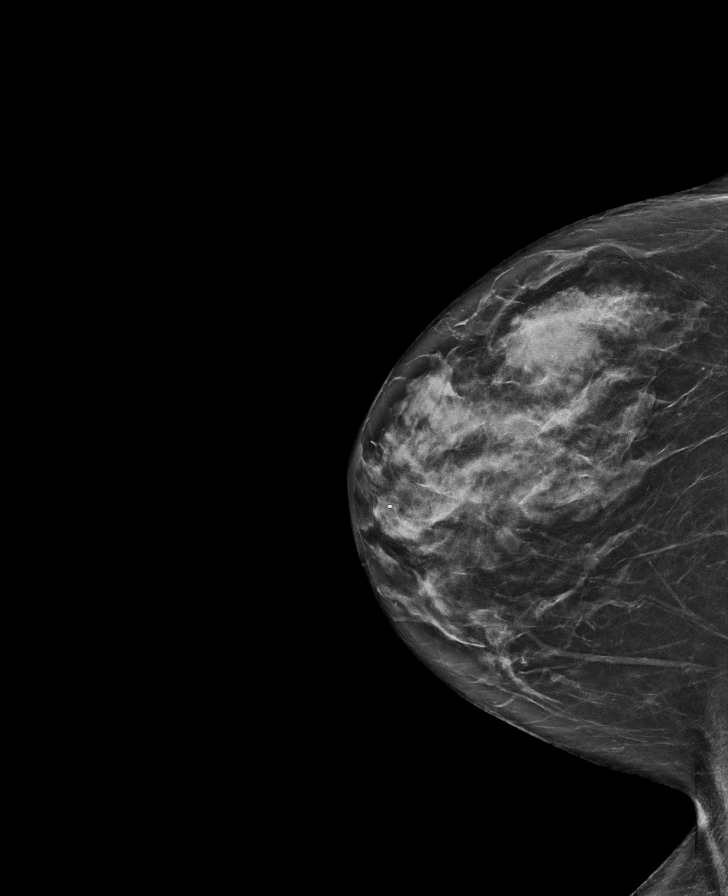

[L MLO synth-2D]
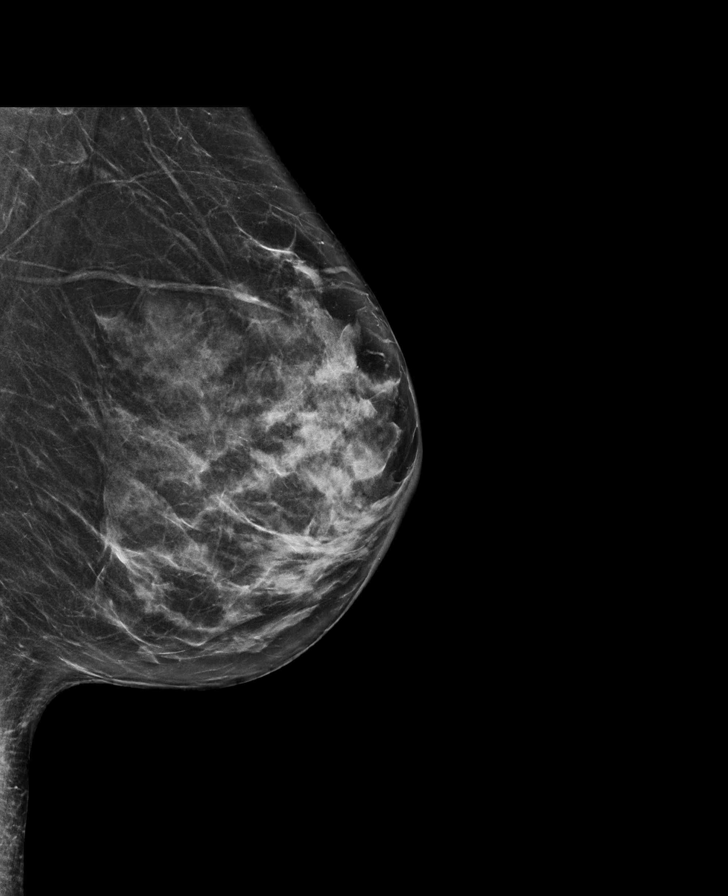

[R CC tomo · tomo slice 37/72.0]
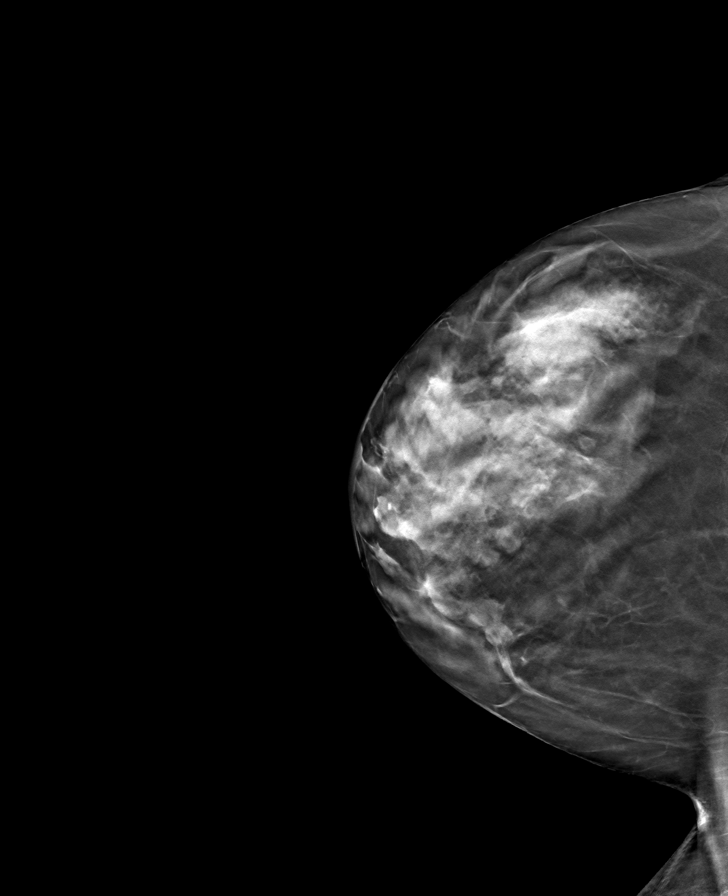

[L CC tomo · tomo slice 37/74.0]
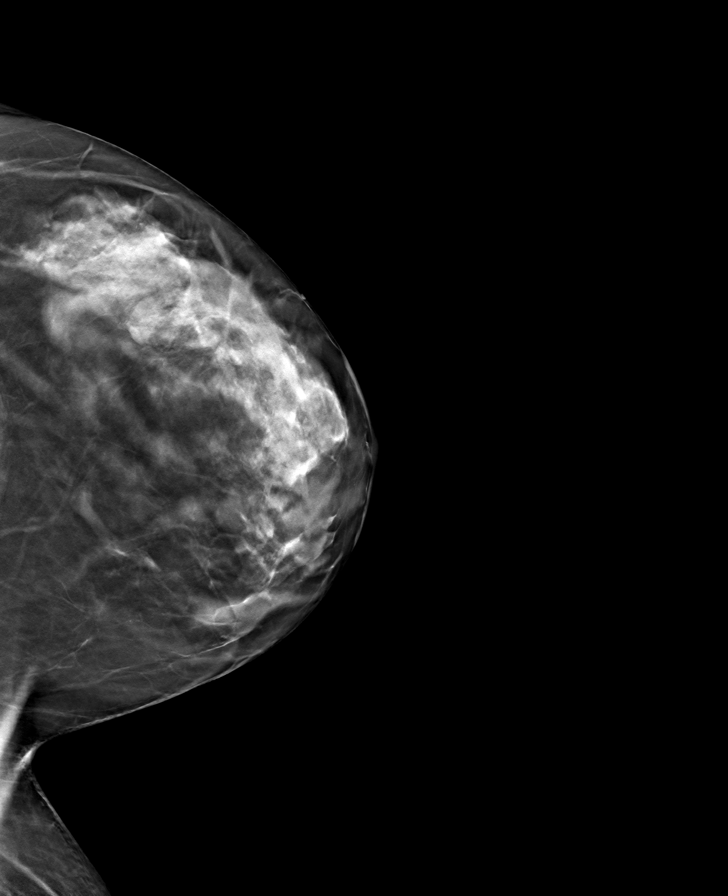

[R MLO tomo · tomo slice 35/70.0]
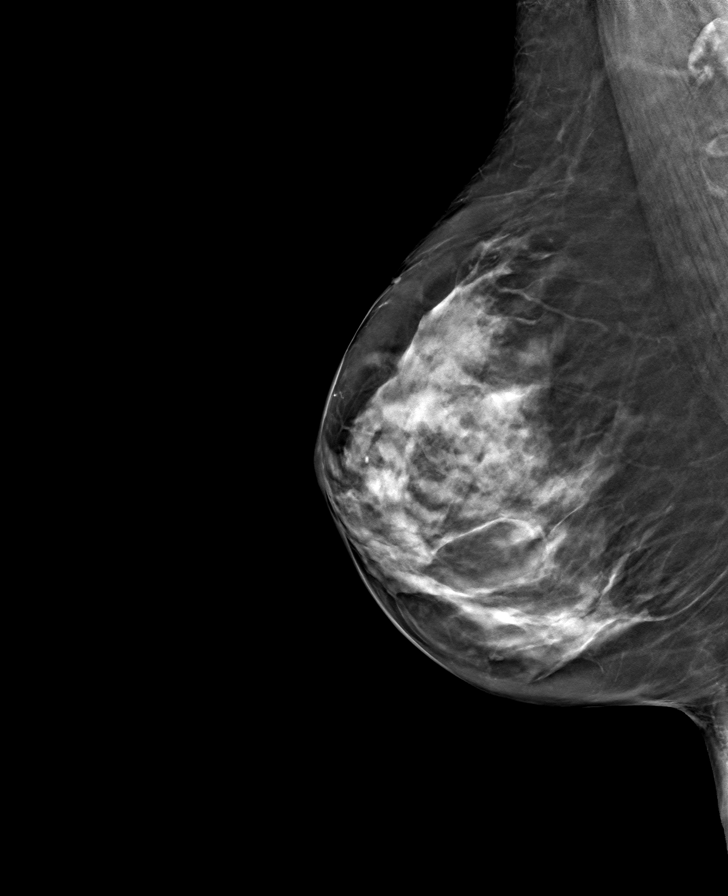

[L MLO tomo · tomo slice 35/70.0]
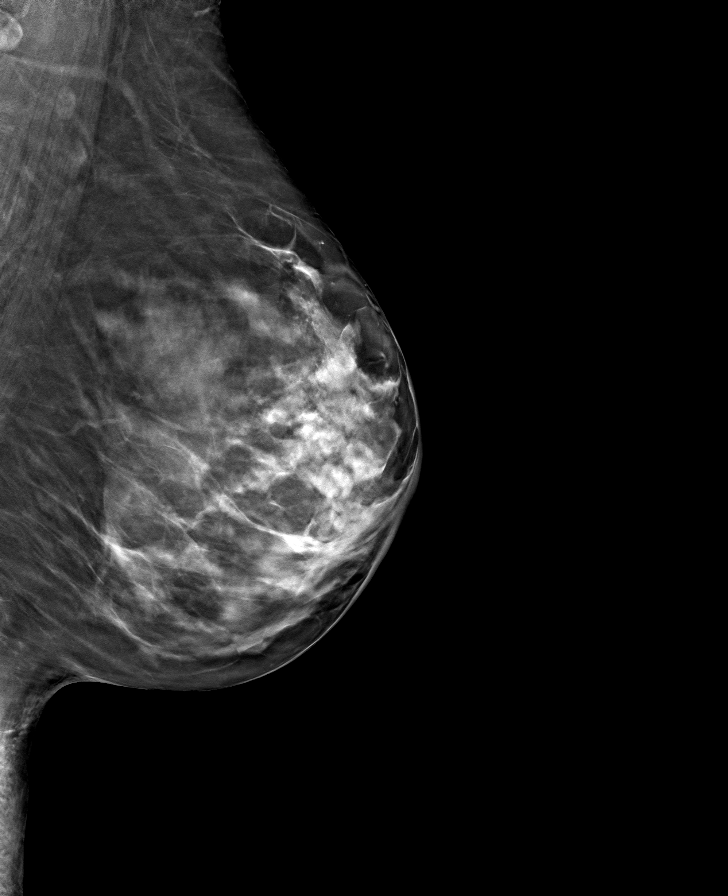

[8 of 24 positions shown; findings below may reference images not displayed]

ACR Breast Density Category c: The breast tissue is heterogeneously
dense, which may obscure small masses.
FINDINGS: In the right breast, a possible mass warrants further evaluation. In
the left breast, no findings suspicious for malignancy.
IMPRESSION: Further evaluation is suggested for a possible mass in the right
breast.

RECOMMENDATION:
Diagnostic mammogram and possibly ultrasound of the right breast.
(Code:82-G-AAO)

The patient will be contacted regarding the findings, and additional
imaging will be scheduled.

BI-RADS CATEGORY  0: Incomplete. Need additional imaging evaluation
and/or prior mammograms for comparison.

## 2024-01-27 ENCOUNTER — Other Ambulatory Visit: Payer: Self-pay | Admitting: Primary Care

## 2024-01-27 DIAGNOSIS — F411 Generalized anxiety disorder: Secondary | ICD-10-CM

## 2024-02-20 ENCOUNTER — Other Ambulatory Visit: Payer: Self-pay | Admitting: Primary Care

## 2024-02-20 DIAGNOSIS — F411 Generalized anxiety disorder: Secondary | ICD-10-CM

## 2024-03-09 ENCOUNTER — Ambulatory Visit (INDEPENDENT_AMBULATORY_CARE_PROVIDER_SITE_OTHER): Admitting: Primary Care

## 2024-03-09 ENCOUNTER — Encounter: Payer: Self-pay | Admitting: Primary Care

## 2024-03-09 VITALS — BP 112/74 | HR 92 | Temp 98.3°F | Ht 63.0 in | Wt 151.2 lb

## 2024-03-09 DIAGNOSIS — G43001 Migraine without aura, not intractable, with status migrainosus: Secondary | ICD-10-CM | POA: Diagnosis not present

## 2024-03-09 DIAGNOSIS — Z0001 Encounter for general adult medical examination with abnormal findings: Secondary | ICD-10-CM

## 2024-03-09 DIAGNOSIS — E785 Hyperlipidemia, unspecified: Secondary | ICD-10-CM

## 2024-03-09 DIAGNOSIS — R051 Acute cough: Secondary | ICD-10-CM | POA: Diagnosis not present

## 2024-03-09 DIAGNOSIS — F411 Generalized anxiety disorder: Secondary | ICD-10-CM

## 2024-03-09 LAB — COMPREHENSIVE METABOLIC PANEL WITH GFR
ALT: 9 U/L (ref 3–35)
AST: 14 U/L (ref 5–37)
Albumin: 4.1 g/dL (ref 3.5–5.2)
Alkaline Phosphatase: 66 U/L (ref 39–117)
BUN: 7 mg/dL (ref 6–23)
CO2: 30 meq/L (ref 19–32)
Calcium: 8.9 mg/dL (ref 8.4–10.5)
Chloride: 102 meq/L (ref 96–112)
Creatinine, Ser: 0.71 mg/dL (ref 0.40–1.20)
GFR: 91.79 mL/min
Glucose, Bld: 86 mg/dL (ref 70–99)
Potassium: 3.7 meq/L (ref 3.5–5.1)
Sodium: 139 meq/L (ref 135–145)
Total Bilirubin: 0.4 mg/dL (ref 0.2–1.2)
Total Protein: 6.4 g/dL (ref 6.0–8.3)

## 2024-03-09 LAB — LIPID PANEL
Cholesterol: 216 mg/dL — ABNORMAL HIGH (ref 28–200)
HDL: 70.4 mg/dL
LDL Cholesterol: 133 mg/dL — ABNORMAL HIGH (ref 10–99)
NonHDL: 146.02
Total CHOL/HDL Ratio: 3
Triglycerides: 64 mg/dL (ref 10.0–149.0)
VLDL: 12.8 mg/dL (ref 0.0–40.0)

## 2024-03-09 LAB — POCT INFLUENZA A/B
Influenza A, POC: NEGATIVE
Influenza B, POC: NEGATIVE

## 2024-03-09 LAB — POC COVID19 BINAXNOW: SARS Coronavirus 2 Ag: NEGATIVE

## 2024-03-09 NOTE — Assessment & Plan Note (Signed)
 Repeat lipid panel pending.  Work on a healthy diet and regular exercise in order for weight loss, and to reduce the risk of further co-morbidity.

## 2024-03-09 NOTE — Assessment & Plan Note (Signed)
 Overall controlled.  Continue Frova  2.5 mg PRN.  Continue cyclobenzaprine  5 mg PRN.

## 2024-03-09 NOTE — Progress Notes (Signed)
 "  Subjective:    Patient ID: Karen Munoz, female    DOB: [birth year], 63 y.o.   MRN: [id number]  Karen Munoz is a very pleasant 63 y.o. female who presents today for complete physical and follow up of chronic conditions.  She would also like to discuss URI symptoms. Sudden onset yesterday with bilateral head pressure, chills, and sore throat. She's also experienced a mild cough. She has yet to have her influenza vaccine. She denies known sick contacts.   She is currently taking Zepbound 5 mg weekly per [employer] since September 2025, has lost 10 pounds since her last visit according to our scales.   She would also like to meet with therapy for her ongoing anxiety. She's experiencing panic attacks more frequently, is taking both Prozac  and Buspar  as prescribed. She lost her husband > 1year ago, this year has been tougher.   Immunizations: -Tetanus: Completed in 2024 -Influenza: Has not completed this season  -Shingles: Never completed, never had chicken pox  Diet: Fair diet.  Exercise: No regular exercise.  Eye exam: Completes annually  Dental exam: Completes semi-annually    Pap Smear: Completed in December 2022, declines today Mammogram: Completed in March 2025  Colonoscopy: Completed in 2021, due 2031  Wt Readings from Last 3 Encounters:  03/09/24 151 lb 4 oz (68.6 kg)  03/04/23 161 lb 6.4 oz (73.2 kg)  07/18/22 151 lb (68.5 kg)   BP Readings from Last 3 Encounters:  03/09/24 112/74  03/04/23 122/86  07/18/22 116/72        Review of Systems  Constitutional:  Positive for chills. Negative for fever and unexpected weight change.  HENT:  Positive for congestion and rhinorrhea.   Respiratory:  Positive for cough. Negative for shortness of breath.   Cardiovascular:  Negative for chest pain.  Gastrointestinal:  Negative for constipation and diarrhea.  Genitourinary:  Negative for difficulty urinating.  Musculoskeletal:  Positive for myalgias. Negative for  arthralgias.  Skin:  Negative for rash.  Allergic/Immunologic: Negative for environmental allergies.  Neurological:  Positive for headaches. Negative for dizziness and numbness.  Psychiatric/Behavioral:  The patient is nervous/anxious.          Past Medical History:  Diagnosis Date   Acute non-recurrent sinusitis 03/04/2023   Allergy    Anxiety    Depression    Epistaxis 02/15/2021   Rash and nonspecific skin eruption 04/22/2016    Social History   Socioeconomic History   Marital status: Unknown    Spouse name: Not on file   Number of children: Not on file   Years of education: Not on file   Highest education level: Bachelor's degree (e.g., BA, AB, BS)  Occupational History   Not on file  Tobacco Use   Smoking status: Former   Smokeless tobacco: Never  Vaping Use   Vaping status: Never Used  Substance and Sexual Activity   Alcohol use: Yes    Comment: occasionally on  monthly basis   Drug use: No   Sexual activity: Not on file  Other Topics Concern   Not on file  Social History Narrative   Not on file   Social Drivers of Health   Tobacco Use: Medium Risk (03/09/2024)   Patient History    Smoking Tobacco Use: Former    Smokeless Tobacco Use: Never    Passive Exposure: Not on file  Financial Resource Strain: Low Risk (03/03/2023)   Overall Financial Resource Strain (CARDIA)    Difficulty of Paying Living  Expenses: Not hard at all  Food Insecurity: No Food Insecurity (03/03/2023)   Hunger Vital Sign    Worried About Running Out of Food in the Last Year: Never true    Ran Out of Food in the Last Year: Never true  Transportation Needs: No Transportation Needs (03/03/2023)   PRAPARE - Administrator, Civil Service (Medical): No    Lack of Transportation (Non-Medical): No  Physical Activity: Insufficiently Active (03/03/2023)   Exercise Vital Sign    Days of Exercise per Week: 2 days    Minutes of Exercise per Session: 30 min  Stress: Stress  Concern Present (03/03/2023)   [employer] of Occupational Health - Occupational Stress Questionnaire    Feeling of Stress : Very much  Social Connections: Socially Isolated (03/03/2023)   Social Connection and Isolation Panel    Frequency of Communication with Friends and Family: More than three times a week    Frequency of Social Gatherings with Friends and Family: Once a week    Attends Religious Services: Never    Database Administrator or Organizations: No    Attends Engineer, Structural: Not on file    Marital Status: Widowed  Intimate Partner Violence: Not on file  Depression (PHQ2-9): Low Risk (03/09/2024)   Depression (PHQ2-9)    PHQ-2 Score: 0  Alcohol Screen: Not on file  Housing: Unknown (03/03/2023)   Housing Stability Vital Sign    Unable to Pay for Housing in the Last Year: No    Number of Times Moved in the Last Year: Not on file    Homeless in the Last Year: No  Utilities: Not on file  Health Literacy: Not on file    Past Surgical History:  Procedure Laterality Date   breast lift     BUNIONECTOMY     x2   COLONOSCOPY  2015   COSMETIC SURGERY  0324   mammoplas     MASTOPEXY Bilateral    4 yrs ago   POLYPECTOMY     2015- SSP x1. TA x 1    WISDOM TOOTH EXTRACTION      Family History  Problem Relation Age of Onset   Colon cancer Maternal Grandfather    Renal Disease Mother    COPD Mother    Hypertension Mother    Hyperlipidemia Mother    Hypertension Brother    Anxiety disorder Brother    Colon polyps Neg Hx    Esophageal cancer Neg Hx    Rectal cancer Neg Hx    Stomach cancer Neg Hx     Allergies[1]  Medications Ordered Prior to Encounter[2]  BP 112/74   Pulse 92   Temp 98.3 F (36.8 C) (Oral)   Ht 5' 3 (1.6 m)   Wt 151 lb 4 oz (68.6 kg)   LMP 06/02/2013   SpO2 98%   BMI 26.79 kg/m  Objective:   Physical Exam Constitutional:      Appearance: She is ill-appearing.  HENT:     Right Ear: Tympanic membrane and ear  canal normal.     Left Ear: Tympanic membrane and ear canal normal.  Eyes:     Pupils: Pupils are equal, round, and reactive to light.  Cardiovascular:     Rate and Rhythm: Normal rate and regular rhythm.  Pulmonary:     Effort: Pulmonary effort is normal.     Breath sounds: Normal breath sounds.  Abdominal:     General: Bowel sounds are normal.  Palpations: Abdomen is soft.     Tenderness: There is no abdominal tenderness.  Musculoskeletal:        General: Normal range of motion.     Cervical back: Neck supple.  Skin:    General: Skin is warm and dry.  Neurological:     Mental Status: She is alert and oriented to person, place, and time.     Cranial Nerves: No cranial nerve deficit.     Deep Tendon Reflexes:     Reflex Scores:      Patellar reflexes are 2+ on the right side and 2+ on the left side. Psychiatric:        Mood and Affect: Mood normal.     Physical Exam        Assessment & Plan:  Encounter for annual general medical examination with abnormal findings in adult Assessment & Plan: Immunizations UTD.  Pap smear UTD.  Mammogram UTD Colonoscopy UTD, due 2031  Discussed the importance of a healthy diet and regular exercise in order for weight loss, and to reduce the risk of further co-morbidity.  Exam stable. Labs pending.  Follow up in 1 year for repeat physical.    Migraine without aura and with status migrainosus, not intractable Assessment & Plan: Overall controlled.  Continue Frova  2.5 mg PRN.  Continue cyclobenzaprine  5 mg PRN.   Acute cough Assessment & Plan: Differentials include viral URI, influenza, Covid-19 infection.  Rapid influenza test negative today Rapid Covid 19 test negative today  Discussed conservative treatment such as fluids, rest, ibuprofen/Tylenol, OTC cough suppressants.   Orders: -     POCT Influenza A/B -     POC COVID-19 BinaxNow  GAD (generalized anxiety disorder) Assessment & Plan: Deteriorated.    Continue fluoxetine  40 mg daily and buspirone  5 mg twice daily for now. Referral placed for therapy  Orders: -     Ambulatory referral to Psychology  Hyperlipidemia, unspecified hyperlipidemia type Assessment & Plan: Repeat lipid panel pending.  Work on a healthy diet and regular exercise in order for weight loss, and to reduce the risk of further co-morbidity.   Orders: -     Lipid panel -     Comprehensive metabolic panel with GFR    Assessment and Plan Assessment & Plan         Comer MARLA Gaskins, NP       [1]  Allergies Allergen Reactions   Penicillins Rash  [2]  Current Outpatient Medications on File Prior to Visit  Medication Sig Dispense Refill   busPIRone  (BUSPAR ) 5 MG tablet TAKE 1 TABLET BY MOUTH 1 OR 2 TIMES DAILY AS NEEDED FOR ANXIETY 180 tablet 0   cetirizine  (ZYRTEC ) 10 MG tablet TAKE 1 TABLET (10 MG TOTAL) BY MOUTH DAILY. FOR ALLERGIES. 90 tablet 1   Cholecalciferol (D3 ADULT PO) Take 125 mcg by mouth daily.     Coenzyme Q10 (CO Q 10 PO) Take 30 mg by mouth daily. 15     Cyanocobalamin (B-12 PO) Take 1,000 mcg by mouth daily.     cyclobenzaprine  (FLEXERIL ) 5 MG tablet TAKE 1 TABLET BY MOUTH AT BEDTIME AS NEEDED FOR MIGRAINES/HEADACHES. 60 tablet 0   FLUoxetine  (PROZAC ) 40 MG capsule TAKE 1 CAPSULE BY MOUTH EVERY DAY FOR ANXIETY 90 capsule 0   frovatriptan  (FROVA ) 2.5 MG tablet Take 1 tablet by mouth at migraine onset. If migraine recurs, may repeat after 2 hours. 10 tablet 0   niacin 250 MG tablet Take 250 mg by mouth  at bedtime.     Omega-3 Fatty Acids (FISH OIL PO) Take 690 mg by mouth.     PSYLLIUM PO Take 1,500 mg by mouth daily.     Red Yeast Rice Extract (RED YEAST RICE PO) Take 1,200 mg by mouth daily. 1.2gms     tirzepatide (ZEPBOUND) 5 MG/0.5ML Pen Inject 5 mg into the skin once a week.     No current facility-administered medications on file prior to visit.   "

## 2024-03-09 NOTE — Patient Instructions (Signed)
 Stop by the lab prior to leaving today. I will notify you of your results once received.   It was a pleasure to see you today!

## 2024-03-09 NOTE — Assessment & Plan Note (Signed)
 Immunizations UTD. Pap smear UTD. Mammogram UTD Colonoscopy UTD, due 2031  Discussed the importance of a healthy diet and regular exercise in order for weight loss, and to reduce the risk of further co-morbidity.  Exam stable. Labs pending.  Follow up in 1 year for repeat physical.

## 2024-03-09 NOTE — Assessment & Plan Note (Signed)
 Deteriorated.   Continue fluoxetine  40 mg daily and buspirone  5 mg twice daily for now. Referral placed for therapy

## 2024-03-09 NOTE — Assessment & Plan Note (Addendum)
 Differentials include viral URI, influenza, Covid-19 infection.  Rapid influenza test negative today Rapid Covid 19 test negative today  Discussed conservative treatment such as fluids, rest, ibuprofen/Tylenol, OTC cough suppressants.

## 2024-03-10 ENCOUNTER — Ambulatory Visit: Payer: Self-pay | Admitting: Primary Care

## 2024-03-14 ENCOUNTER — Other Ambulatory Visit: Payer: Self-pay | Admitting: Primary Care

## 2024-03-14 DIAGNOSIS — J302 Other seasonal allergic rhinitis: Secondary | ICD-10-CM
